# Patient Record
Sex: Male | Born: 1950
Health system: Southern US, Community
[De-identification: ages and names within clinical notes are randomized; demographics above are authoritative.]

## PROBLEM LIST (undated history)

## (undated) DIAGNOSIS — T7840XA Allergy, unspecified, initial encounter: Secondary | ICD-10-CM

## (undated) DIAGNOSIS — G4730 Sleep apnea, unspecified: Secondary | ICD-10-CM

## (undated) DIAGNOSIS — E785 Hyperlipidemia, unspecified: Secondary | ICD-10-CM

## (undated) DIAGNOSIS — M1990 Unspecified osteoarthritis, unspecified site: Secondary | ICD-10-CM

## (undated) DIAGNOSIS — I10 Essential (primary) hypertension: Secondary | ICD-10-CM

## (undated) DIAGNOSIS — T4145XA Adverse effect of unspecified anesthetic, initial encounter: Secondary | ICD-10-CM

## (undated) DIAGNOSIS — E119 Type 2 diabetes mellitus without complications: Secondary | ICD-10-CM

## (undated) DIAGNOSIS — F419 Anxiety disorder, unspecified: Secondary | ICD-10-CM

## (undated) DIAGNOSIS — E079 Disorder of thyroid, unspecified: Secondary | ICD-10-CM

## (undated) DIAGNOSIS — T8859XA Other complications of anesthesia, initial encounter: Secondary | ICD-10-CM

## (undated) DIAGNOSIS — C801 Malignant (primary) neoplasm, unspecified: Secondary | ICD-10-CM

## (undated) DIAGNOSIS — Z8719 Personal history of other diseases of the digestive system: Secondary | ICD-10-CM

## (undated) HISTORY — PX: JOINT REPLACEMENT: SHX530

## (undated) HISTORY — PX: TONSILLECTOMY: SUR1361

## (undated) HISTORY — DX: Anxiety disorder, unspecified: F41.9

## (undated) HISTORY — DX: Allergy, unspecified, initial encounter: T78.40XA

## (undated) HISTORY — DX: Essential (primary) hypertension: I10

## (undated) HISTORY — DX: Disorder of thyroid, unspecified: E07.9

## (undated) HISTORY — DX: Hyperlipidemia, unspecified: E78.5

## (undated) HISTORY — DX: Type 2 diabetes mellitus without complications: E11.9

## (undated) HISTORY — PX: BACK SURGERY: SHX140

## (undated) HISTORY — PX: SHOULDER SURGERY: SHX246

---

## 2007-12-10 ENCOUNTER — Encounter: Admission: RE | Admit: 2007-12-10 | Discharge: 2007-12-10 | Payer: Self-pay | Admitting: Family Medicine

## 2008-03-01 ENCOUNTER — Encounter (INDEPENDENT_AMBULATORY_CARE_PROVIDER_SITE_OTHER): Payer: Self-pay | Admitting: Interventional Radiology

## 2008-03-01 ENCOUNTER — Encounter: Admission: RE | Admit: 2008-03-01 | Discharge: 2008-03-01 | Payer: Self-pay | Admitting: Endocrinology

## 2008-03-01 ENCOUNTER — Other Ambulatory Visit: Admission: RE | Admit: 2008-03-01 | Discharge: 2008-03-01 | Payer: Self-pay | Admitting: Interventional Radiology

## 2008-03-02 ENCOUNTER — Encounter (INDEPENDENT_AMBULATORY_CARE_PROVIDER_SITE_OTHER): Payer: Self-pay | Admitting: Interventional Radiology

## 2008-11-22 ENCOUNTER — Encounter: Admission: RE | Admit: 2008-11-22 | Discharge: 2008-11-22 | Payer: Self-pay | Admitting: Endocrinology

## 2010-01-09 ENCOUNTER — Encounter: Admission: RE | Admit: 2010-01-09 | Discharge: 2010-01-09 | Payer: Self-pay | Admitting: Endocrinology

## 2010-08-24 ENCOUNTER — Other Ambulatory Visit: Payer: Self-pay | Admitting: Endocrinology

## 2010-08-24 DIAGNOSIS — E041 Nontoxic single thyroid nodule: Secondary | ICD-10-CM

## 2010-08-25 ENCOUNTER — Encounter: Payer: Self-pay | Admitting: Endocrinology

## 2011-01-08 ENCOUNTER — Other Ambulatory Visit: Payer: Self-pay

## 2011-01-10 ENCOUNTER — Ambulatory Visit
Admission: RE | Admit: 2011-01-10 | Discharge: 2011-01-10 | Disposition: A | Discharge status: Home / Self Care | Payer: BC Managed Care – PPO | Source: Ambulatory Visit | Attending: Endocrinology | Admitting: Endocrinology

## 2011-01-10 DIAGNOSIS — E041 Nontoxic single thyroid nodule: Secondary | ICD-10-CM

## 2011-01-14 ENCOUNTER — Other Ambulatory Visit: Payer: Self-pay

## 2011-10-02 ENCOUNTER — Other Ambulatory Visit: Payer: Self-pay | Admitting: Endocrinology

## 2011-10-02 DIAGNOSIS — E041 Nontoxic single thyroid nodule: Secondary | ICD-10-CM

## 2012-03-22 ENCOUNTER — Ambulatory Visit
Admission: RE | Admit: 2012-03-22 | Discharge: 2012-03-22 | Disposition: A | Discharge status: Home / Self Care | Payer: BC Managed Care – PPO | Source: Ambulatory Visit | Attending: Endocrinology | Admitting: Endocrinology

## 2012-03-22 DIAGNOSIS — E041 Nontoxic single thyroid nodule: Secondary | ICD-10-CM

## 2012-04-27 ENCOUNTER — Other Ambulatory Visit: Payer: Self-pay | Admitting: Gastroenterology

## 2012-12-06 ENCOUNTER — Other Ambulatory Visit: Payer: Self-pay | Admitting: Endocrinology

## 2012-12-06 DIAGNOSIS — E041 Nontoxic single thyroid nodule: Secondary | ICD-10-CM

## 2013-03-21 ENCOUNTER — Other Ambulatory Visit: Payer: BC Managed Care – PPO

## 2013-03-24 ENCOUNTER — Ambulatory Visit
Admission: RE | Admit: 2013-03-24 | Discharge: 2013-03-24 | Disposition: A | Discharge status: Home / Self Care | Payer: BC Managed Care – PPO | Source: Ambulatory Visit | Attending: Endocrinology | Admitting: Endocrinology

## 2013-03-24 DIAGNOSIS — E041 Nontoxic single thyroid nodule: Secondary | ICD-10-CM

## 2013-04-19 ENCOUNTER — Encounter (INDEPENDENT_AMBULATORY_CARE_PROVIDER_SITE_OTHER): Payer: Self-pay | Admitting: Surgery

## 2013-04-26 ENCOUNTER — Encounter (INDEPENDENT_AMBULATORY_CARE_PROVIDER_SITE_OTHER): Payer: Self-pay | Admitting: Surgery

## 2013-04-26 ENCOUNTER — Encounter (INDEPENDENT_AMBULATORY_CARE_PROVIDER_SITE_OTHER): Payer: Self-pay

## 2013-04-26 ENCOUNTER — Ambulatory Visit (INDEPENDENT_AMBULATORY_CARE_PROVIDER_SITE_OTHER): Payer: BC Managed Care – PPO | Admitting: Surgery

## 2013-04-26 VITALS — BP 134/76 | HR 68 | Resp 16 | Ht 68.0 in | Wt 238.4 lb

## 2013-04-26 DIAGNOSIS — D449 Neoplasm of uncertain behavior of unspecified endocrine gland: Secondary | ICD-10-CM

## 2013-04-26 DIAGNOSIS — D440 Neoplasm of uncertain behavior of thyroid gland: Secondary | ICD-10-CM

## 2013-04-26 NOTE — Patient Instructions (Signed)

## 2013-04-26 NOTE — Progress Notes (Signed)
General Surgery [facility]  Chief Complaint  Patient presents with  . New Evaluation    eval right thyroid nodule - referral from Dr. Adela Lank    HISTORY: Patient is a 62 year old male referred by his primary physician for evaluation of enlarging thyroid nodule. Patient has a history of thyroid nodules dating back over 10 years. He has been followed here as well as previously in [place]. 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 He has had sequential ultrasound examinations. His most recent study showed an interval increase in size of the dominant right sided nodule. The study was performed in August 2014 and shows a 3.9 x 2.2 x 2.6 cm nodule in the right lobe. There is also an 8 mm nodule in the thyroid isthmus. No nodules were identified in the left thyroid lobe.  Patient has had no prior history of head or neck surgery. He has been on thyroid medication for over 10 years and currently takes 250 mcg of levothyroxin daily. There is a family history of thyroid disease with his son having hyperthyroidism and his daughter having thyroid nodules. There is no family history of thyroid malignancy. There is no family history of other endocrine neoplasms.  Past Medical History  Diagnosis Date  . Hypertension   . Hyperlipidemia   . Thyroid disease   . Anxiety   . Diabetes mellitus without complication   . Allergy     Current Outpatient Prescriptions  Medication Sig Dispense Refill  . aspirin 81 MG tablet Take 81 mg by mouth daily.      . CELEBREX 200 MG capsule       . diazepam (VALIUM) 5 MG tablet Take 5 mg by mouth every 6 (six) hours as needed for anxiety.      . enalapril-hydrochlorothiazide (VASERETIC) 10-25 MG per tablet Take 1 tablet by mouth daily.      Marland Kitchen FLUoxetine (PROZAC) 40 MG capsule       . levothyroxine (SYNTHROID, LEVOTHROID) 25 MCG tablet Take 25 mcg by mouth daily before breakfast.       . loratadine (CLARITIN) 10 MG tablet Take 10 mg by mouth daily.      Marland Kitchen LYRICA 50 MG capsule       . metFORMIN (GLUCOPHAGE) 500 MG tablet Take 500 mg by mouth 2 (two) times daily with a meal.      . rosuvastatin (CRESTOR) 20 MG tablet Take 20 mg by mouth daily.       No current facility-administered medications for this visit.    No Known Allergies  History reviewed. No pertinent family history.  History   Social History  . Marital Status: Married    Spouse Name: N/A    Number of Children: N/A  . Years of Education: N/A   Social History Main Topics  . Smoking status: Never Smoker   . Smokeless tobacco: Never Used  . Alcohol Use: Yes     Comment: rare  . Drug Use: No  . Sexual Activity: None   Other Topics Concern  . None   Social History Narrative  . None    REVIEW OF SYSTEMS - PERTINENT POSITIVES ONLY: Denies tremor. Denies palpitation. Denies compressive symptoms.  EXAM: Filed Vitals:   04/26/13 1136  BP: 134/76  Pulse: 68  Resp: 16    HEENT: normocephalic; pupils equal and reactive; sclerae clear; dentition good; mucous membranes moist NECK:  Palpable nodule right thyroid  lobe, smooth, mobile with swallowing, nontender; nodule in the isthmus is not palpable; symmetric on extension; no palpable anterior or posterior cervical lymphadenopathy; no supraclavicular masses; no tenderness CHEST: clear to auscultation bilaterally without rales, rhonchi, or wheezes CARDIAC: regular rate and rhythm without significant murmur; peripheral pulses are full EXT:  non-tender without edema; no deformity NEURO: no gross focal deficits; no sign of tremor   LABORATORY RESULTS: See [facility] (CHL-Epic) for most recent results  RADIOLOGY RESULTS: See [facility] (CHL-Epic) for most recent results  IMPRESSION: #1 dominant right thyroid nodule, 3.9 cm, with interval enlargement and Hurthle cell cytopathology #2 hypothyroidism  PLAN: I had a lengthy discussion  with the patient and his wife regarding the above findings. We reviewed his test results. I have recommended a right thyroid lobectomy for definitive diagnosis. We discussed total thyroidectomy in the event of malignancy. We discussed the risk and benefits of the various procedures including the risk of injury to recurrent laryngeal nerves and the parathyroid glands. We discussed the hospital stay to be anticipated and the postoperative recovery. They understand and wish to proceed with surgery in the near future. We will make arrangements.  The risks and benefits of the procedure have been discussed at length with the patient.  The patient understands the proposed procedure, potential alternative treatments, and the course of recovery to be expected.  All of the patient's questions have been answered at this time.  The patient wishes to proceed with surgery.  Velora Heckler, MD, FACS General & Endocrine Surgery [facility]  Primary Care Physician: Michiel Sites, MD

## 2013-06-16 ENCOUNTER — Other Ambulatory Visit (HOSPITAL_COMMUNITY): Payer: Self-pay | Admitting: *Deleted

## 2013-06-17 ENCOUNTER — Encounter (INDEPENDENT_AMBULATORY_CARE_PROVIDER_SITE_OTHER): Payer: Self-pay

## 2013-06-17 ENCOUNTER — Ambulatory Visit (HOSPITAL_COMMUNITY)
Admission: RE | Admit: 2013-06-17 | Discharge: 2013-06-17 | Disposition: A | Discharge status: Home / Self Care | Payer: BC Managed Care – PPO | Source: Ambulatory Visit | Attending: Surgery | Admitting: Surgery

## 2013-06-17 ENCOUNTER — Encounter (HOSPITAL_COMMUNITY): Payer: Self-pay

## 2013-06-17 ENCOUNTER — Encounter (HOSPITAL_COMMUNITY): Payer: Self-pay | Admitting: Pharmacy Technician

## 2013-06-17 ENCOUNTER — Encounter (HOSPITAL_COMMUNITY)
Admission: RE | Admit: 2013-06-17 | Discharge: 2013-06-17 | Disposition: A | Discharge status: Home / Self Care | Payer: BC Managed Care – PPO | Source: Ambulatory Visit | Attending: Surgery | Admitting: Surgery

## 2013-06-17 DIAGNOSIS — I10 Essential (primary) hypertension: Secondary | ICD-10-CM | POA: Insufficient documentation

## 2013-06-17 DIAGNOSIS — Z01810 Encounter for preprocedural cardiovascular examination: Secondary | ICD-10-CM | POA: Insufficient documentation

## 2013-06-17 DIAGNOSIS — Z01812 Encounter for preprocedural laboratory examination: Secondary | ICD-10-CM | POA: Insufficient documentation

## 2013-06-17 DIAGNOSIS — Z01818 Encounter for other preprocedural examination: Secondary | ICD-10-CM | POA: Insufficient documentation

## 2013-06-17 HISTORY — DX: Unspecified osteoarthritis, unspecified site: M19.90

## 2013-06-17 HISTORY — DX: Personal history of other diseases of the digestive system: Z87.19

## 2013-06-17 HISTORY — DX: Adverse effect of unspecified anesthetic, initial encounter: T41.45XA

## 2013-06-17 HISTORY — DX: Sleep apnea, unspecified: G47.30

## 2013-06-17 HISTORY — DX: Other complications of anesthesia, initial encounter: T88.59XA

## 2013-06-17 LAB — CBC
Hemoglobin: 14.7 g/dL (ref 13.0–17.0)
MCH: 30.2 pg (ref 26.0–34.0)
MCHC: 34.2 g/dL (ref 30.0–36.0)
MCV: 88.5 fL (ref 78.0–100.0)
RBC: 4.86 MIL/uL (ref 4.22–5.81)

## 2013-06-17 LAB — BASIC METABOLIC PANEL
BUN: 18 mg/dL (ref 6–23)
CO2: 26 mEq/L (ref 19–32)
Glucose, Bld: 166 mg/dL — ABNORMAL HIGH (ref 70–99)
Potassium: 3.8 mEq/L (ref 3.5–5.1)
Sodium: 138 mEq/L (ref 135–145)

## 2013-06-17 NOTE — Patient Instructions (Signed)
20      Your procedure is scheduled on:  Thursday 06/23/2013  Report to [facility] at   0700 AM.  Call this number if you have problems the night before or morning of surgery:  [phone]   Remember:             IF YOU USE CPAP,BRING MASK AND TUBING AM OF SURGERY!   Do not eat food or drink liquids AFTER MIDNIGHT!  Take these medicines the morning of surgery with A SIP OF WATER: Levothyroxine, Claritin   Do not bring valuables to the hospital. [facility] IS NOT RESPONSIBLE  FOR ANY BELONGINGS OR VALUABLES BROUGHT TO HOSPITAL.  Marland Kitchen  Leave suitcase in the car. After surgery it may be brought to your room.  For patients admitted to the hospital, checkout time is 11:00 AM the day of              Discharge.    DO NOT WEAR JEWELRY , MAKE-UP, LOTIONS,POWDERS,PERFUMES!             WOMEN -DO NOT SHAVE LEGS OR UNDERARMS 12 HRS. BEFORE  SURGERY!               MEN MAY SHAVE AS USUAL!             CONTACTS,DENTURES OR BRIDGEWORK, FALSE EYELASHES MAY NOT BE WORN INTO SURGERY!                                           Patients discharged the day of surgery will not be allowed to drive home. If going home the same day of surgery, must have someone stay with you  first 24 hrs.at home and arrange for someone to drive you home from the Hospital.                         YOUR DRIVER IS:   Special Instructions:             Please read over the following fact sheets that you were given:             1. [place] PREPARING FOR SURGERY SHEET              2.INC[facility]IVE SPIROMETRY                                        [place].Deeann Servidio,RN,BSN     [phone]                FAILURE TO FOLLOW THESE INSTRUCTIONS MAY RESULT IN CANCELLATION OF YOUR SURGERY!               Patient Signature:___________________________

## 2013-06-18 NOTE — Progress Notes (Signed)
Quick Note:  These results are acceptable for scheduled surgery.  Nadege Carriger M. Severo Beber, MD, FACS [facility] Office: [phone]   ______ 

## 2013-06-23 ENCOUNTER — Ambulatory Visit (HOSPITAL_COMMUNITY): Payer: BC Managed Care – PPO | Admitting: Anesthesiology

## 2013-06-23 ENCOUNTER — Encounter (HOSPITAL_COMMUNITY): Payer: Self-pay | Admitting: *Deleted

## 2013-06-23 ENCOUNTER — Encounter (HOSPITAL_COMMUNITY): Payer: BC Managed Care – PPO | Admitting: Anesthesiology

## 2013-06-23 ENCOUNTER — Encounter (HOSPITAL_COMMUNITY)
Admission: RE | Disposition: A | Discharge status: Home / Self Care | Payer: Self-pay | Source: Ambulatory Visit | Attending: Surgery

## 2013-06-23 ENCOUNTER — Observation Stay (HOSPITAL_COMMUNITY)
Admission: RE | Admit: 2013-06-23 | Discharge: 2013-06-24 | Disposition: A | Discharge status: Home / Self Care | Payer: BC Managed Care – PPO | Source: Ambulatory Visit | Attending: Surgery | Admitting: Surgery

## 2013-06-23 DIAGNOSIS — E063 Autoimmune thyroiditis: Secondary | ICD-10-CM | POA: Insufficient documentation

## 2013-06-23 DIAGNOSIS — E119 Type 2 diabetes mellitus without complications: Secondary | ICD-10-CM | POA: Insufficient documentation

## 2013-06-23 DIAGNOSIS — I10 Essential (primary) hypertension: Secondary | ICD-10-CM | POA: Insufficient documentation

## 2013-06-23 DIAGNOSIS — D440 Neoplasm of uncertain behavior of thyroid gland: Secondary | ICD-10-CM

## 2013-06-23 DIAGNOSIS — Z7982 Long term (current) use of aspirin: Secondary | ICD-10-CM | POA: Insufficient documentation

## 2013-06-23 DIAGNOSIS — Z96659 Presence of unspecified artificial knee joint: Secondary | ICD-10-CM | POA: Insufficient documentation

## 2013-06-23 DIAGNOSIS — E785 Hyperlipidemia, unspecified: Secondary | ICD-10-CM | POA: Insufficient documentation

## 2013-06-23 DIAGNOSIS — C73 Malignant neoplasm of thyroid gland: Principal | ICD-10-CM | POA: Insufficient documentation

## 2013-06-23 DIAGNOSIS — G4730 Sleep apnea, unspecified: Secondary | ICD-10-CM | POA: Insufficient documentation

## 2013-06-23 DIAGNOSIS — Z79899 Other long term (current) drug therapy: Secondary | ICD-10-CM | POA: Insufficient documentation

## 2013-06-23 HISTORY — PX: THYROID LOBECTOMY: SHX420

## 2013-06-23 LAB — GLUCOSE, CAPILLARY
Glucose-Capillary: 120 mg/dL — ABNORMAL HIGH (ref 70–99)
Glucose-Capillary: 152 mg/dL — ABNORMAL HIGH (ref 70–99)

## 2013-06-23 SURGERY — LOBECTOMY, THYROID
Anesthesia: General | Site: Neck | Laterality: Right | Wound class: Clean

## 2013-06-23 MED ORDER — LIDOCAINE HCL (CARDIAC) 20 MG/ML IV SOLN
INTRAVENOUS | Status: AC
  Filled 2013-06-23: qty 5

## 2013-06-23 MED ORDER — ACETAMINOPHEN 325 MG PO TABS
650.0000 mg | ORAL_TABLET | ORAL | Status: DC | PRN
  Administered 2013-06-24: 650 mg via ORAL
  Filled 2013-06-23: qty 2

## 2013-06-23 MED ORDER — ONDANSETRON HCL 4 MG PO TABS: 4.0000 mg | ORAL_TABLET | Freq: Four times a day (QID) | ORAL | Status: DC | PRN

## 2013-06-23 MED ORDER — HYDROMORPHONE HCL PF 1 MG/ML IJ SOLN
INTRAMUSCULAR | Status: AC
  Filled 2013-06-23: qty 1

## 2013-06-23 MED ORDER — HYDROMORPHONE HCL PF 1 MG/ML IJ SOLN
1.0000 mg | INTRAMUSCULAR | Status: DC | PRN
  Administered 2013-06-23 (×2): 1 mg via INTRAVENOUS
  Filled 2013-06-23: qty 1

## 2013-06-23 MED ORDER — ONDANSETRON HCL 4 MG/2ML IJ SOLN
INTRAMUSCULAR | Status: DC | PRN
  Administered 2013-06-23: 4 mg via INTRAVENOUS

## 2013-06-23 MED ORDER — DIAZEPAM 5 MG PO TABS
5.0000 mg | ORAL_TABLET | Freq: Four times a day (QID) | ORAL | Status: DC | PRN
  Administered 2013-06-23: 5 mg via ORAL
  Filled 2013-06-23: qty 1

## 2013-06-23 MED ORDER — GLYCOPYRROLATE 0.2 MG/ML IJ SOLN
INTRAMUSCULAR | Status: DC | PRN
  Administered 2013-06-23: 0.6 mg via INTRAVENOUS

## 2013-06-23 MED ORDER — PROPOFOL 10 MG/ML IV BOLUS
INTRAVENOUS | Status: AC
  Filled 2013-06-23: qty 20

## 2013-06-23 MED ORDER — LACTATED RINGERS IV SOLN
INTRAVENOUS | Status: DC | PRN
  Administered 2013-06-23 (×2): via INTRAVENOUS

## 2013-06-23 MED ORDER — HYDROMORPHONE HCL PF 1 MG/ML IJ SOLN
0.2500 mg | INTRAMUSCULAR | Status: DC | PRN
  Administered 2013-06-23 (×4): 0.5 mg via INTRAVENOUS

## 2013-06-23 MED ORDER — ONDANSETRON HCL 4 MG/2ML IJ SOLN: 4.0000 mg | Freq: Four times a day (QID) | INTRAMUSCULAR | Status: DC | PRN

## 2013-06-23 MED ORDER — CEFAZOLIN SODIUM-DEXTROSE 2-3 GM-% IV SOLR
2.0000 g | INTRAVENOUS | Status: AC
  Administered 2013-06-23: 2 g via INTRAVENOUS

## 2013-06-23 MED ORDER — ENALAPRIL MALEATE 10 MG PO TABS
10.0000 mg | ORAL_TABLET | Freq: Every day | ORAL | Status: DC
  Administered 2013-06-23 – 2013-06-24 (×2): 10 mg via ORAL
  Filled 2013-06-23 (×2): qty 1

## 2013-06-23 MED ORDER — LIDOCAINE HCL (PF) 2 % IJ SOLN
INTRAMUSCULAR | Status: DC | PRN
  Administered 2013-06-23: 75 mg via INTRADERMAL

## 2013-06-23 MED ORDER — PREGABALIN 50 MG PO CAPS
50.0000 mg | ORAL_CAPSULE | ORAL | Status: DC
  Administered 2013-06-23: 50 mg via ORAL
  Filled 2013-06-23: qty 1

## 2013-06-23 MED ORDER — ZOLPIDEM TARTRATE 10 MG PO TABS
10.0000 mg | ORAL_TABLET | Freq: Every evening | ORAL | Status: DC | PRN
  Administered 2013-06-23: 10 mg via ORAL
  Filled 2013-06-23: qty 1

## 2013-06-23 MED ORDER — MIDAZOLAM HCL 5 MG/5ML IJ SOLN
INTRAMUSCULAR | Status: DC | PRN
  Administered 2013-06-23: 2 mg via INTRAVENOUS

## 2013-06-23 MED ORDER — CEFAZOLIN SODIUM-DEXTROSE 2-3 GM-% IV SOLR
INTRAVENOUS | Status: AC
  Filled 2013-06-23: qty 50

## 2013-06-23 MED ORDER — ENALAPRIL-HYDROCHLOROTHIAZIDE 10-25 MG PO TABS: 1.0000 | ORAL_TABLET | Freq: Every day | ORAL | Status: DC

## 2013-06-23 MED ORDER — MIDAZOLAM HCL 2 MG/2ML IJ SOLN
INTRAMUSCULAR | Status: AC
  Filled 2013-06-23: qty 2

## 2013-06-23 MED ORDER — FLUOXETINE HCL 20 MG PO CAPS
40.0000 mg | ORAL_CAPSULE | Freq: Every day | ORAL | Status: DC
  Administered 2013-06-23 – 2013-06-24 (×2): 40 mg via ORAL
  Filled 2013-06-23 (×2): qty 2

## 2013-06-23 MED ORDER — HYDROMORPHONE HCL PF 1 MG/ML IJ SOLN
INTRAMUSCULAR | Status: AC
  Administered 2013-06-23: 1 mg via INTRAVENOUS
  Filled 2013-06-23: qty 1

## 2013-06-23 MED ORDER — FENTANYL CITRATE 0.05 MG/ML IJ SOLN
INTRAMUSCULAR | Status: DC | PRN
  Administered 2013-06-23 (×3): 100 ug via INTRAVENOUS
  Administered 2013-06-23: 50 ug via INTRAVENOUS

## 2013-06-23 MED ORDER — FENTANYL CITRATE 0.05 MG/ML IJ SOLN
INTRAMUSCULAR | Status: AC
  Filled 2013-06-23: qty 2

## 2013-06-23 MED ORDER — NEOSTIGMINE METHYLSULFATE 1 MG/ML IJ SOLN
INTRAMUSCULAR | Status: AC
  Filled 2013-06-23: qty 10

## 2013-06-23 MED ORDER — PROPOFOL 10 MG/ML IV BOLUS
INTRAVENOUS | Status: DC | PRN
  Administered 2013-06-23: 200 mg via INTRAVENOUS

## 2013-06-23 MED ORDER — HYDROCHLOROTHIAZIDE 25 MG PO TABS
25.0000 mg | ORAL_TABLET | Freq: Every day | ORAL | Status: DC
  Administered 2013-06-23 – 2013-06-24 (×2): 25 mg via ORAL
  Filled 2013-06-23 (×2): qty 1

## 2013-06-23 MED ORDER — ONDANSETRON HCL 4 MG/2ML IJ SOLN
INTRAMUSCULAR | Status: AC
  Filled 2013-06-23: qty 2

## 2013-06-23 MED ORDER — GLYCOPYRROLATE 0.2 MG/ML IJ SOLN
INTRAMUSCULAR | Status: AC
  Filled 2013-06-23: qty 3

## 2013-06-23 MED ORDER — ROCURONIUM BROMIDE 100 MG/10ML IV SOLN
INTRAVENOUS | Status: AC
  Filled 2013-06-23: qty 1

## 2013-06-23 MED ORDER — KCL IN DEXTROSE-NACL 20-5-0.45 MEQ/L-%-% IV SOLN
INTRAVENOUS | Status: DC
  Administered 2013-06-23 – 2013-06-24 (×2): via INTRAVENOUS
  Filled 2013-06-23 (×2): qty 1000

## 2013-06-23 MED ORDER — METFORMIN HCL 500 MG PO TABS
500.0000 mg | ORAL_TABLET | Freq: Two times a day (BID) | ORAL | Status: DC
Start: 2013-06-23 — End: 2013-06-24
  Administered 2013-06-23 – 2013-06-24 (×2): 500 mg via ORAL
  Filled 2013-06-23 (×4): qty 1

## 2013-06-23 MED ORDER — LACTATED RINGERS IV SOLN: INTRAVENOUS | Status: DC

## 2013-06-23 MED ORDER — FENTANYL CITRATE 0.05 MG/ML IJ SOLN
INTRAMUSCULAR | Status: AC
  Filled 2013-06-23: qty 5

## 2013-06-23 MED ORDER — ROCURONIUM BROMIDE 100 MG/10ML IV SOLN
INTRAVENOUS | Status: DC | PRN
  Administered 2013-06-23: 60 mg via INTRAVENOUS
  Administered 2013-06-23: 20 mg via INTRAVENOUS

## 2013-06-23 MED ORDER — NEOSTIGMINE METHYLSULFATE 1 MG/ML IJ SOLN
INTRAMUSCULAR | Status: DC | PRN
  Administered 2013-06-23: 4 mg via INTRAVENOUS

## 2013-06-23 MED ORDER — LEVOTHYROXINE SODIUM 25 MCG PO TABS
25.0000 ug | ORAL_TABLET | Freq: Every day | ORAL | Status: DC
  Administered 2013-06-24: 25 ug via ORAL
  Filled 2013-06-23 (×2): qty 1

## 2013-06-23 MED ORDER — HYDROCODONE-ACETAMINOPHEN 5-325 MG PO TABS
1.0000 | ORAL_TABLET | ORAL | Status: DC | PRN
  Administered 2013-06-23 – 2013-06-24 (×2): 2 via ORAL
  Filled 2013-06-23 (×2): qty 2

## 2013-06-23 SURGICAL SUPPLY — 36 items
ATTRACTOMAT 16X20 MAGNETIC DRP (DRAPES) ×2 IMPLANT
BENZOIN TINCTURE PRP APPL 2/3 (GAUZE/BANDAGES/DRESSINGS) ×2 IMPLANT
BLADE HEX COATED 2.75 (ELECTRODE) ×2 IMPLANT
BLADE SURG 15 STRL LF DISP TIS (BLADE) ×1 IMPLANT
BLADE SURG 15 STRL SS (BLADE) ×1
CANISTER SUCTION 2500CC (MISCELLANEOUS) ×2 IMPLANT
CHLORAPREP W/TINT 10.5 ML (MISCELLANEOUS) ×2 IMPLANT
CLIP TI MEDIUM 6 (CLIP) ×4 IMPLANT
CLIP TI WIDE RED SMALL 6 (CLIP) ×6 IMPLANT
DISSECTOR ROUND CHERRY 3/8 STR (MISCELLANEOUS) IMPLANT
DRAPE PED LAPAROTOMY (DRAPES) ×2 IMPLANT
DRESSING SURGICEL FIBRLLR 1X2 (HEMOSTASIS) ×1 IMPLANT
DRSG SURGICEL FIBRILLAR 1X2 (HEMOSTASIS) ×2
ELECT REM PT RETURN 9FT ADLT (ELECTROSURGICAL) ×2
ELECTRODE REM PT RTRN 9FT ADLT (ELECTROSURGICAL) ×1 IMPLANT
GAUZE SPONGE 4X4 16PLY XRAY LF (GAUZE/BANDAGES/DRESSINGS) ×2 IMPLANT
GLOVE SURG ORTHO 8.0 STRL STRW (GLOVE) ×12 IMPLANT
GOWN PREVENTION PLUS LG XLONG (DISPOSABLE) ×2 IMPLANT
GOWN STRL REIN XL XLG (GOWN DISPOSABLE) ×4 IMPLANT
KIT BASIN OR (CUSTOM PROCEDURE TRAY) ×2 IMPLANT
NS IRRIG 1000ML POUR BTL (IV SOLUTION) ×2 IMPLANT
PACK BASIC VI WITH GOWN DISP (CUSTOM PROCEDURE TRAY) ×2 IMPLANT
PENCIL BUTTON HOLSTER BLD 10FT (ELECTRODE) ×2 IMPLANT
SHEARS HARMONIC 9CM CVD (BLADE) ×2 IMPLANT
SPONGE GAUZE 4X4 12PLY (GAUZE/BANDAGES/DRESSINGS) IMPLANT
STAPLER VISISTAT 35W (STAPLE) ×2 IMPLANT
STRIP CLOSURE SKIN 1/2X4 (GAUZE/BANDAGES/DRESSINGS) ×2 IMPLANT
SUT MNCRL AB 4-0 PS2 18 (SUTURE) ×2 IMPLANT
SUT SILK 2 0 (SUTURE) ×1
SUT SILK 2-0 18XBRD TIE 12 (SUTURE) ×1 IMPLANT
SUT SILK 3 0 (SUTURE)
SUT SILK 3-0 18XBRD TIE 12 (SUTURE) IMPLANT
SUT VIC AB 3-0 SH 18 (SUTURE) ×4 IMPLANT
SYR BULB IRRIGATION 50ML (SYRINGE) ×2 IMPLANT
TOWEL OR 17X26 10 PK STRL BLUE (TOWEL DISPOSABLE) ×2 IMPLANT
YANKAUER SUCT BULB TIP 10FT TU (MISCELLANEOUS) ×2 IMPLANT

## 2013-06-23 NOTE — Anesthesia Postprocedure Evaluation (Signed)
  Anesthesia Post-op Note  Patient: Allen Mcdaniel  Procedure(s) Performed: Procedure(s) (LRB): THYROID LOBECTOMY (Right)  Patient Location: PACU  Anesthesia Type: General  Level of Consciousness: awake and alert   Airway and Oxygen Therapy: Patient Spontanous Breathing  Post-op Pain: mild  Post-op Assessment: Post-op Vital signs reviewed, Patient's Cardiovascular Status Stable, Respiratory Function Stable, Patent Airway and No signs of Nausea or vomiting  Last Vitals:  Filed Vitals:   06/23/13 1145  BP:   Pulse:   Temp: 36.9 C  Resp:     Post-op Vital Signs: stable   Complications: No apparent anesthesia complications

## 2013-06-23 NOTE — Op Note (Signed)
NAMEISMAEL, KARGE NO.:  [id number]  MEDICAL RECORD NO.:  [id number]  LOCATION:  1511                         FACILITY:  [facility]  PHYSICIAN:  Velora Heckler, MD      DATE OF BIRTH:  May 30, 1951  DATE OF PROCEDURE:  06/23/2013                               OPERATIVE REPORT   PREOPERATIVE DIAGNOSIS:  Right thyroid nodule, Hurthle cell neoplasm.  POSTOPERATIVE DIAGNOSIS:  Right thyroid nodule, Hurthle cell neoplasm.  PROCEDURE:  Right thyroid lobectomy.  SURGEON:  Velora Heckler, MD, FACS  ANESTHESIA:  General per Dr. Ronelle Nigh.  ESTIMATED BLOOD LOSS:  Minimal.  PREPARATION:  ChloraPrep.  COMPLICATIONS:  None.  INDICATIONS:  The patient is a 62 year old male referred by his primary care physician for evaluation of enlarging thyroid nodule.  The patient has a history of thyroid nodules dating back over 10 years.  He has been followed in [place] and in [place].  Previous fine-needle aspiration of the dominant right thyroid nodule showed Hurthle cells. Sequential ultrasound examination have shown an interval increase in the size of the dominant right-sided thyroid nodule.  In August 2014, it measured 3.9 x 2.2 x 2.6 cm.  There was an 8-mm nodule in the thyroid isthmus.  No nodules were identified in the left thyroid lobe.  The patient now comes to Surgery for right thyroid lobectomy and isthmusectomy.  BODY OF REPORT:  Procedure was done in OR #1 at the [facility].  The patient was brought to the operating room, placed in a supine position on the operating room table.  Following administration of general anesthesia, the patient was positioned and then prepped and draped in the usual aseptic fashion.  After ascertaining that an adequate level of anesthesia had been achieved, a Kocher incision was made with a #15 blade.  Dissection was carried down through subcutaneous tissues and platysma.  Hemostasis was obtained  with electrocautery.  Skin flaps were elevated cephalad and caudad from the thyroid notch to the sternal notch.  A Mahorner self-retaining retractor was placed for exposure.  Strap muscles were incised in the midline, and the left thyroid lobe was exposed.  It is small.  There are no dominant masses or nodules present.  Next, we turned our attention to the right thyroid lobe.  Strap muscles were reflected laterally exposing a moderately enlarged right lobe. There was a dominant nodule in the mid and inferior portion of the right lobe.  There was a smaller nodule in the right side of the isthmus. Right lobe was gently dissected out with blunt dissection.  Superior pole vessels were dissected out and divided individually between small and medium Ligaclips with the Harmonic scalpel.  Gland was rolled anteriorly.  Inferior venous tributaries were divided between Ligaclips with the Harmonic scalpel.  Parathyroid tissue was identified and preserved.  Branches of the inferior thyroid artery were divided between small Ligaclips with the Harmonic scalpel.  Recurrent laryngeal nerve was identified and preserved.  Ligament of Allyson Sabal was released with the electrocautery and the gland was mobilized onto the anterior trachea. It was mobilized across the midline.  The isthmus was transected with the Harmonic scalpel at  its junction with the left thyroid lobe.  The entire right thyroid lobe and isthmus were submitted to Pathology for review.  Neck was irrigated with warm saline and good hemostasis was achieved with the electrocautery.  Fibrillar was placed throughout the operative field.  Palpation of the neck reveals no lymphadenopathy.  Strap muscles were reapproximated in the midline with interrupted 3-0 Vicryl sutures. Platysma was closed with interrupted 3-0 Vicryl sutures.  Skin was closed with a running 4-0 Monocryl subcuticular suture.  Wound was washed and dried and benzoin and Steri-Strips  were applied.  Sterile dressings were applied.  The patient was awakened from anesthesia and brought to the recovery room.  The patient tolerated the procedure well.   Velora Heckler, MD, FACS General & Endocrine Surgery [facility] Office: [phone]   TMG/MEDQ  D:  06/23/2013  T:  06/23/2013  Job:  [id number]  cc:   Brooke Bonito, M.D. Fax: [phone]

## 2013-06-23 NOTE — Anesthesia Preprocedure Evaluation (Addendum)
Anesthesia Evaluation  Patient identified by MRN, date of birth, ID band Patient awake    Reviewed: Allergy & Precautions, H&P , NPO status , Patient's Chart, lab work & pertinent test results, reviewed documented beta blocker date and time   Airway Mallampati: III TM Distance: >3 FB Neck ROM: full    Dental no notable dental hx. (+) Teeth Intact and Dental Advisory Given   Pulmonary sleep apnea and Continuous Positive Airway Pressure Ventilation ,  breath sounds clear to auscultation  Pulmonary exam normal       Cardiovascular Exercise Tolerance: Good hypertension, Pt. on medications Rhythm:regular Rate:Normal  RBBB   Neuro/Psych negative neurological ROS  negative psych ROS   GI/Hepatic negative GI ROS, Neg liver ROS, hiatal hernia,   Endo/Other  diabetes, Well Controlled, Type 2, Oral Hypoglycemic Agents  Renal/GU negative Renal ROS  negative genitourinary   Musculoskeletal   Abdominal   Peds  Hematology negative hematology ROS (+)   Anesthesia Other Findings   Reproductive/Obstetrics negative OB ROS                          Anesthesia Physical Anesthesia Plan  ASA: III  Anesthesia Plan: General   Post-op Pain Management:    Induction: Intravenous  Airway Management Planned: Oral ETT  Additional Equipment:   Intra-op Plan:   Post-operative Plan: Extubation in OR  Informed Consent: I have reviewed the patients History and Physical, chart, labs and discussed the procedure including the risks, benefits and alternatives for the proposed anesthesia with the patient or authorized representative who has indicated his/her understanding and acceptance.   Dental Advisory Given  Plan Discussed with: CRNA and Surgeon  Anesthesia Plan Comments:         Anesthesia Quick Evaluation

## 2013-06-23 NOTE — H&P (Signed)
Allen Mcdaniel is an 62 y.o. male.    General Surgery [facility]  Chief Complaint: right thyroid Hurthle cell neoplasm  HISTORY:  Patient is a 62 year old male referred by his primary physician for evaluation of enlarging thyroid nodule. Patient has a history of thyroid nodules dating back over 10 years. He has been followed here as well as previously in [place]. 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 He has had sequential ultrasound examinations. His most recent study showed an interval increase in size of the dominant right sided nodule. The study was performed in August 2014 and shows a 3.9 x 2.2 x 2.6 cm nodule in the right lobe. There is also an 8 mm nodule in the thyroid isthmus. No nodules were identified in the left thyroid lobe.  Past Medical History  Diagnosis Date  . Hypertension   . Hyperlipidemia   . Thyroid disease   . Anxiety   . Diabetes mellitus without complication   . Allergy   . Complication of anesthesia     with back surgery-too much Morphine,O2 too low  . Sleep apnea     setting -18  . H/O hiatal hernia   . Arthritis     Past Surgical History  Procedure Laterality Date  . Shoulder surgery    . Joint replacement      left and right knee  . Back surgery    . Tonsillectomy      as child    History reviewed. No pertinent family history. Social History:  reports that he has never smoked. He has never used smokeless tobacco. He reports that he drinks alcohol. He reports that he does not use illicit drugs.  Allergies: No Known Allergies  Medications Prior to Admission  Medication Sig Dispense Refill  . aspirin 81 MG tablet Take 81 mg by mouth daily.      . CELEBREX 200 MG capsule Take 200 mg by mouth once a week.       . diazepam (VALIUM) 5 MG tablet Take 5 mg by mouth every 6 (six) hours as needed for anxiety.      . enalapril-hydrochlorothiazide (VASERETIC)  10-25 MG per tablet Take 1 tablet by mouth daily.      Marland Kitchen FLUoxetine (PROZAC) 40 MG capsule Take 40 mg by mouth daily.       Marland Kitchen levothyroxine (SYNTHROID, LEVOTHROID) 25 MCG tablet Take 25 mcg by mouth daily before breakfast.      . loratadine (CLARITIN) 10 MG tablet Take 10 mg by mouth daily.      . metFORMIN (GLUCOPHAGE) 500 MG tablet Take 500 mg by mouth 2 (two) times daily with a meal.      . predniSONE (DELTASONE) 20 MG tablet Take 20 mg by mouth daily with breakfast. Take 3 tablets x 3 days, 2 tablets x 3 days, 1 tablet x 3 days once a day orally for 9 days      . pregabalin (LYRICA) 50 MG capsule Take 50 mg by mouth 2 (two) times a week.      . rosuvastatin (CRESTOR) 20 MG tablet Take 10 mg by mouth daily.         Results for orders placed during the hospital encounter of 06/23/13 (from the past 48 hour(s))  GLUCOSE, CAPILLARY     Status: Abnormal   Collection Time    06/23/13  8:15 AM      Result Value  Range   Glucose-Capillary 141 (*) 70 - 99 mg/dL   Comment 1 Documented in Chart     No results found.  Review of Systems  Constitutional: Negative.   HENT: Negative.   Eyes: Negative.   Respiratory: Negative.   Cardiovascular: Negative.   Gastrointestinal: Negative.   Genitourinary: Negative.   Musculoskeletal: Negative.   Skin: Negative.   Neurological: Negative.   Endo/Heme/Allergies: Negative.   Psychiatric/Behavioral: Negative.     Blood pressure 141/80, pulse 82, temperature 98.6 F (37 C), temperature source Oral, resp. rate 18, SpO2 98.00%. Physical Exam  Constitutional: He is oriented to person, place, and time. He appears well-developed and well-nourished. No distress.  HENT:  Head: Normocephalic and atraumatic.  Right Ear: External ear normal.  Left Ear: External ear normal.  Mouth/Throat: No oropharyngeal exudate.  Eyes: Conjunctivae are normal. Pupils are equal, round, and reactive to light. No scleral icterus.  Neck: Normal range of motion. Neck supple.  Tracheal deviation present. Thyromegaly present.  Dominant palpable nodule in right thyroid lobe, mobile, non-tender  Cardiovascular: Normal rate, regular rhythm and normal heart sounds.   No murmur heard. Respiratory: Effort normal and breath sounds normal. He has no wheezes.  GI: Soft. Bowel sounds are normal. He exhibits no distension.  Musculoskeletal: Normal range of motion. He exhibits no edema.  Lymphadenopathy:    He has no cervical adenopathy.  Neurological: He is alert and oriented to person, place, and time.  Skin: Skin is warm and dry.  Psychiatric: He has a normal mood and affect. His behavior is normal.     Assessment/Plan Right thyroid nodule, Hurthle cell neoplasm  Plan right thyroid lobectomy  The risks and benefits of the procedure have been discussed at length with the patient.  The patient understands the proposed procedure, potential alternative treatments, and the course of recovery to be expected.  All of the patient's questions have been answered at this time.  The patient wishes to proceed with surgery.  Velora Heckler, MD, FACS General & Endocrine Surgery [facility] Office: [phone]   Roux Brandy M 06/23/2013, 9:05 AM

## 2013-06-23 NOTE — Brief Op Note (Signed)
06/23/2013  10:38 AM  PATIENT:  Allen Mcdaniel  62 y.o. male  PRE-OPERATIVE DIAGNOSIS:  thyroid neoplasm of uncertain behavior   POST-OPERATIVE DIAGNOSIS:  thyroid neoplasm of uncertain behavior  PROCEDURE:  Procedure(s): THYROID LOBECTOMY (Right)  SURGEON:  Surgeon(s) and Role:    * Velora Heckler, MD - Primary  ANESTHESIA:   general  EBL:  Total I/O In: 1000 [I.V.:1000] Out: -   BLOOD ADMINISTERED:none  DRAINS: none   LOCAL MEDICATIONS USED:  NONE  SPECIMEN:  Excision  DISPOSITION OF SPECIMEN:  PATHOLOGY  COUNTS:  YES  TOURNIQUET:  * No tourniquets in log *  DICTATION: .Other Dictation: Dictation Number [id number]  PLAN OF CARE: Admit for overnight observation  PATIENT DISPOSITION:  PACU - hemodynamically stable.   Delay start of Pharmacological VTE agent (>24hrs) due to surgical blood loss or risk of bleeding: yes  Velora Heckler, MD, [facility] Office: [phone]

## 2013-06-23 NOTE — Transfer of Care (Signed)
Immediate Anesthesia Transfer of Care Note  Patient: Allen Mcdaniel  Procedure(s) Performed: Procedure(s): THYROID LOBECTOMY (Right)  Patient Location: PACU  Anesthesia Type:General  Level of Consciousness: awake, alert , oriented and patient cooperative  Airway & Oxygen Therapy: Patient Spontanous Breathing and Patient connected to face mask oxygen  Post-op Assessment: Report given to PACU RN, Post -op Vital signs reviewed and stable and Patient moving all extremities X 4  Post vital signs: Reviewed and stable  Complications: No apparent anesthesia complications

## 2013-06-24 ENCOUNTER — Encounter (HOSPITAL_COMMUNITY): Payer: Self-pay | Admitting: Surgery

## 2013-06-24 LAB — GLUCOSE, CAPILLARY
Glucose-Capillary: 182 mg/dL — ABNORMAL HIGH (ref 70–99)
Glucose-Capillary: 115 mg/dL — ABNORMAL HIGH (ref 70–99)

## 2013-06-24 MED ORDER — HYDROCODONE-ACETAMINOPHEN 5-325 MG PO TABS: 1.0000 | ORAL_TABLET | ORAL | Status: DC | PRN

## 2013-06-24 MED ORDER — SODIUM CHLORIDE 0.9 % IJ SOLN: 3.0000 mL | INTRAMUSCULAR | Status: DC | PRN

## 2013-06-24 MED ORDER — METOPROLOL TARTRATE 12.5 MG HALF TABLET
12.5000 mg | ORAL_TABLET | Freq: Two times a day (BID) | ORAL | Status: DC | PRN
  Filled 2013-06-24: qty 1

## 2013-06-24 MED ORDER — LORATADINE 10 MG PO TABS
10.0000 mg | ORAL_TABLET | Freq: Every day | ORAL | Status: DC
  Administered 2013-06-24: 10 mg via ORAL
  Filled 2013-06-24: qty 1

## 2013-06-24 MED ORDER — METOPROLOL TARTRATE 1 MG/ML IV SOLN
5.0000 mg | Freq: Four times a day (QID) | INTRAVENOUS | Status: DC | PRN
  Filled 2013-06-24: qty 5

## 2013-06-24 MED ORDER — SODIUM CHLORIDE 0.9 % IV SOLN: 250.0000 mL | INTRAVENOUS | Status: DC | PRN

## 2013-06-24 MED ORDER — SODIUM CHLORIDE 0.9 % IJ SOLN: 3.0000 mL | Freq: Two times a day (BID) | INTRAMUSCULAR | Status: DC

## 2013-06-24 NOTE — Discharge Summary (Signed)
Physician Discharge Summary [facility]  Patient ID: Allen Mcdaniel MRN: [id number] DOB/AGE: 63-19-1952 63 y.o.  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 Patient has a history of thyroid nodules dating back over 10 years. He has been followed here as well as previously in [place]. 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 He has had sequential ultrasound examinations. His most recent study showed an interval increase in size of the dominant right sided nodule. The study was performed in August 2014 and shows a 3.9 x 2.2 x 2.6 cm nodule in the right lobe. There is also an 8 mm nodule in the thyroid isthmus. No nodules were identified in the left thyroid lobe.  Patient was admitted for observation following thyroid surgery.  Post op course was uncomplicated.  Pain was well controlled.  Tolerated diet.  Patient was prepared for discharge home on POD#1.  Consults: None  Significant Diagnostic Studies: none  Treatments: surgery: right thyroid lobectomy and isthmusectomy  Discharge Exam: Blood pressure 138/81, pulse 90, temperature 98.2 F (36.8 C), temperature source Oral, resp. rate 20, height 5\' 8"  (1.727 m), weight 240 lb (108.863 kg), SpO2 95.00%. HEENT - clear Neck - wound clear and dry; minimal STS; voice normal Chest - clear bilaterally Cor - RRR  Disposition: Home with family  Discharge Orders   Future Orders Complete By Expires   Apply dressing  As directed    Scheduling Instructions:     Apply light gauze dressing to wound before discharge home today.   Diet - low sodium heart healthy  As directed    Discharge instructions  As directed    Comments:     THYROID & PARATHYROID SURGERY - POST OP INSTRUCTIONS  Always review your discharge instruction sheet from the facility where your surgery was performed.  A prescription for pain medication may be given to you upon discharge.  Take your pain medication as prescribed.  If narcotic pain medicine is not needed, then you may take acetaminophen (Tylenol) or ibuprofen (Advil) as needed.  Take your usually prescribed medications unless otherwise directed.  If you need a refill on your pain medication, please contact your pharmacy. They will contact our office to request authorization.  Prescriptions will not be processed after 5 pm or on weekends.  Start with a light diet upon arrival home, such as soup and crackers or toast.  Be sure to drink plenty of fluids daily.  Resume your normal diet the day after surgery.  Most patients will experience some swelling and bruising on the chest and neck area.  Ice packs will help.  Swelling and bruising can take several days to resolve.   It is common to experience some constipation if taking pain medication after surgery.  Increasing fluid intake and taking a stool softener will usually help or prevent this problem.  A mild laxative (Milk of Magnesia or Miralax) should be taken according to package directions if there are no bowel movements after 48 hours.  You may remove your bandages 24-48 hours after surgery, and you may shower at that time.  You have steri-strips (small skin tapes) in place  directly over the incision.  These strips should be left on the skin for 7-10 days and then removed.  You may resume regular (light) daily activities beginning the next day-such as daily self-care, walking, climbing stairs-gradually increasing activities as tolerated.  You may have sexual intercourse when it is comfortable.  Refrain from any heavy lifting or straining until  approved by your doctor.  You may drive when you no longer are taking prescription pain medication, you can comfortably wear a seatbelt, and you can safely maneuver your car and apply brakes.  You should see your doctor in the office for a follow-up appointment approximately two to three weeks after your surgery.  Make sure that you call for this appointment within a day or two after you arrive home to insure a convenient appointment time.  WHEN TO CALL YOUR DOCTOR: -- Fever greater than 101.5 -- Inability to urinate -- Nausea and/or vomiting - persistent -- Extreme swelling or bruising -- Continued bleeding from incision -- Increased pain, redness, or drainage from the incision -- Difficulty swallowing or breathing -- Muscle cramping or spasms -- Numbness or tingling in hands or around lips  The clinic staff is available to answer your questions during regular business hours.  Please don't hesitate to call and ask to speak to one of the nurses if you have concerns.  Velora Heckler, MD, FACS General & Endocrine Surgery [facility] Office: [phone]   Increase activity slowly  As directed    Remove dressing in 24 hours  As directed    Scheduling Instructions:     Apply light gauze dressing to wound before discharge home today.       Medication List         aspirin 81 MG tablet  Take 81 mg by mouth daily.     CELEBREX 200 MG capsule  Generic drug:  celecoxib  Take 200 mg by mouth once a week.     diazepam 5 MG tablet  Commonly known as:  VALIUM  Take 5 mg by mouth every 6 (six) hours as needed for anxiety.     enalapril-hydrochlorothiazide 10-25 MG per tablet  Commonly known as:  VASERETIC  Take 1 tablet by mouth daily.     FLUoxetine 40 MG capsule  Commonly known as:  PROZAC  Take 40 mg by mouth daily.     HYDROcodone-acetaminophen 5-325 MG per tablet  Commonly known as:  NORCO/VICODIN  Take 1-2 tablets by mouth every 4 (four) hours as needed  for moderate pain.     levothyroxine 25 MCG tablet  Commonly known as:  SYNTHROID, LEVOTHROID  Take 25 mcg by mouth daily before breakfast.     loratadine 10 MG tablet  Commonly known as:  CLARITIN  Take 10 mg by mouth daily.     metFORMIN 500 MG tablet  Commonly known as:  GLUCOPHAGE  Take 500 mg by mouth 2 (two) times daily with a meal.     predniSONE 20 MG tablet  Commonly known as:  DELTASONE  Take 20 mg by mouth daily with breakfast. Take 3 tablets x 3 days, 2 tablets x 3 days, 1 tablet x 3 days once a day orally for 9 days     pregabalin 50 MG capsule  Commonly known as:  LYRICA  Take 50 mg by mouth 2 (two) times a week.     rosuvastatin 20 MG tablet  Commonly known as:  CRESTOR  Take 10 mg by mouth daily.  zolpidem 10 MG tablet  Commonly known as:  AMBIEN  Take 10 mg by mouth at bedtime as needed for sleep.           Follow-up Information   Follow up with Velora Heckler, MD. Schedule an appointment as soon as possible for a visit in 3 weeks.   Specialty:  General Surgery   Contact information:   [address] [place] [address] [phone]       Velora Heckler, MD, [facility] Office: [phone]   Signed: Velora Heckler 06/24/2013, 12:30 PM

## 2013-06-24 NOTE — Progress Notes (Signed)
Patient to be discharged home in stable condition.  No change from morning assessment.  Discharge instructions and scripts provided to pt and spouse with verbal understanding.   No questions or concerns at this time.

## 2013-06-24 NOTE — Progress Notes (Signed)
UR completed 

## 2013-06-28 ENCOUNTER — Encounter (INDEPENDENT_AMBULATORY_CARE_PROVIDER_SITE_OTHER): Payer: Self-pay

## 2013-06-28 ENCOUNTER — Telehealth (INDEPENDENT_AMBULATORY_CARE_PROVIDER_SITE_OTHER): Payer: Self-pay

## 2013-06-28 NOTE — Telephone Encounter (Signed)
Per pt request letter retrieved and mailed to pt.

## 2013-06-28 NOTE — Telephone Encounter (Signed)
Letter ready in epic. Copy at front desk for pick up.

## 2013-06-28 NOTE — Telephone Encounter (Signed)
Message copied by Joanette Gula on Tue Jun 28, 2013  3:06 PM ------      Message from: Mervin Kung      Created: Tue Jun 28, 2013  2:45 PM      Contact: [phone]       Arline Asp,      Pt would like to go back to work on 07/11/2013. Please call him when note is ready.       Sonya.  ------

## 2013-06-28 NOTE — Telephone Encounter (Signed)
LMOM for pt to call to get appt date. Appt in system.

## 2013-07-05 ENCOUNTER — Telehealth (INDEPENDENT_AMBULATORY_CARE_PROVIDER_SITE_OTHER): Payer: Self-pay | Admitting: Surgery

## 2013-07-05 DIAGNOSIS — D440 Neoplasm of uncertain behavior of thyroid gland: Secondary | ICD-10-CM

## 2013-07-05 NOTE — Telephone Encounter (Signed)
Telephone call to patient with final pathology results from right thyroid lobectomy on 06/23/2013. The dominant feature is lymphocytic thyroiditis with hyperplastic nodules. Incidental finding of a 2 mm follicular variant of papillary thyroid carcinoma. Margins were negative.  I explained to the patient that this was an incidental finding and that no further surgical therapy would be necessary. I have recommended a ultrasound examination of the residual left thyroid lobe in one year.  Patient will be seen in the office for his postoperative visit on 07/21/2013.  Velora Heckler, MD, [facility] Office: [phone]

## 2013-07-21 ENCOUNTER — Ambulatory Visit (INDEPENDENT_AMBULATORY_CARE_PROVIDER_SITE_OTHER): Payer: BC Managed Care – PPO | Admitting: Surgery

## 2013-07-21 ENCOUNTER — Encounter (INDEPENDENT_AMBULATORY_CARE_PROVIDER_SITE_OTHER): Payer: Self-pay

## 2013-07-21 ENCOUNTER — Encounter (INDEPENDENT_AMBULATORY_CARE_PROVIDER_SITE_OTHER): Payer: Self-pay | Admitting: Surgery

## 2013-07-21 VITALS — BP 130/76 | HR 78 | Temp 97.3°F | Resp 18 | Wt 244.0 lb

## 2013-07-21 DIAGNOSIS — C73 Malignant neoplasm of thyroid gland: Secondary | ICD-10-CM

## 2013-07-21 DIAGNOSIS — E063 Autoimmune thyroiditis: Secondary | ICD-10-CM | POA: Insufficient documentation

## 2013-07-21 NOTE — Patient Instructions (Signed)
  COCOA BUTTER & VITAMIN E CREAM  ([employer] or other brand)  Apply cocoa butter/vitamin E cream to your incision 2 - 3 times daily.  Massage cream into incision for one minute with each application.  Use sunscreen (50 SPF or higher) for first 6 months after surgery if area is exposed to sun.  You may substitute Mederma or other scar reducing creams as desired.   

## 2013-07-21 NOTE — Progress Notes (Signed)
General Surgery [facility]  Chief Complaint  Patient presents with  . Routine Post Op    thyroidectomy 06/23/2013    HISTORY: Patient is a 62 year old male who underwent right thyroid lobectomy on 06/23/2013. Final pathology showed adenomatous nodules with extensive lymphocytic thyroiditis. An incidental finding was made of a 2 mm follicular variant of papillary thyroid carcinoma. Margins were negative. Postoperatively the patient has done well. He has had minimal pain. He is currently taking Synthroid 125 mcg daily.  EXAM: Surgical incision is healed nicely. Mild soft tissue swelling. No sign of seroma. No sign of infection. Voice quality is normal.  IMPRESSION: #1 status post right thyroid lobectomy #2 extensive lymphocytic thyroiditis with adenomatous nodules #3 incidental finding of follicular variant of papillary thyroid carcinoma, 2 mm, right thyroid lobe  PLAN: Patient plans to see Dr. Juleen China next month. He is scheduled for laboratory work on 08/08/2013. He may need to have his Synthroid dosage adjusted at that time.  I reviewed the pathology report with the patient at length. I provided him with a copy for his records. My recommendation for management of the 2 mm carcinoma is observation. We will ask Dr. Juleen China for his opinion on further management.  Patient will begin applying topical creams to his incision. He will return for wound check in 6 weeks.  Velora Heckler, MD, FACS General & Endocrine Surgery [facility]   Visit Diagnoses: 1. Thyroiditis, lymphocytic   2. Papillary thyroid carcinoma, follicular variant, 2mm, right lobe

## 2013-11-02 ENCOUNTER — Encounter (INDEPENDENT_AMBULATORY_CARE_PROVIDER_SITE_OTHER): Payer: BC Managed Care – PPO | Admitting: Ophthalmology

## 2013-11-02 DIAGNOSIS — H2510 Age-related nuclear cataract, unspecified eye: Secondary | ICD-10-CM

## 2013-11-02 DIAGNOSIS — E1139 Type 2 diabetes mellitus with other diabetic ophthalmic complication: Secondary | ICD-10-CM

## 2013-11-02 DIAGNOSIS — H35349 Macular cyst, hole, or pseudohole, unspecified eye: Secondary | ICD-10-CM

## 2013-11-02 DIAGNOSIS — E1165 Type 2 diabetes mellitus with hyperglycemia: Secondary | ICD-10-CM

## 2013-11-02 DIAGNOSIS — I10 Essential (primary) hypertension: Secondary | ICD-10-CM

## 2013-11-02 DIAGNOSIS — H3530 Unspecified macular degeneration: Secondary | ICD-10-CM

## 2013-11-02 DIAGNOSIS — H35379 Puckering of macula, unspecified eye: Secondary | ICD-10-CM

## 2013-11-02 DIAGNOSIS — E11319 Type 2 diabetes mellitus with unspecified diabetic retinopathy without macular edema: Secondary | ICD-10-CM

## 2013-11-02 DIAGNOSIS — H35039 Hypertensive retinopathy, unspecified eye: Secondary | ICD-10-CM

## 2013-11-02 DIAGNOSIS — H43819 Vitreous degeneration, unspecified eye: Secondary | ICD-10-CM

## 2014-02-21 ENCOUNTER — Other Ambulatory Visit: Payer: Self-pay | Admitting: Endocrinology

## 2014-02-21 DIAGNOSIS — E041 Nontoxic single thyroid nodule: Secondary | ICD-10-CM

## 2014-02-27 ENCOUNTER — Ambulatory Visit
Admission: RE | Admit: 2014-02-27 | Discharge: 2014-02-27 | Disposition: A | Discharge status: Home / Self Care | Payer: BC Managed Care – PPO | Source: Ambulatory Visit | Attending: Endocrinology | Admitting: Endocrinology

## 2014-02-27 DIAGNOSIS — E041 Nontoxic single thyroid nodule: Secondary | ICD-10-CM

## 2015-02-16 DIAGNOSIS — E789 Disorder of lipoprotein metabolism, unspecified: Secondary | ICD-10-CM | POA: Diagnosis not present

## 2015-02-16 DIAGNOSIS — E041 Nontoxic single thyroid nodule: Secondary | ICD-10-CM | POA: Diagnosis not present

## 2015-02-21 DIAGNOSIS — E785 Hyperlipidemia, unspecified: Secondary | ICD-10-CM | POA: Diagnosis not present

## 2015-02-21 DIAGNOSIS — E669 Obesity, unspecified: Secondary | ICD-10-CM | POA: Diagnosis not present

## 2015-02-21 DIAGNOSIS — N529 Male erectile dysfunction, unspecified: Secondary | ICD-10-CM | POA: Diagnosis not present

## 2015-02-21 DIAGNOSIS — F419 Anxiety disorder, unspecified: Secondary | ICD-10-CM | POA: Diagnosis not present

## 2015-02-21 DIAGNOSIS — G4700 Insomnia, unspecified: Secondary | ICD-10-CM | POA: Diagnosis not present

## 2015-02-21 DIAGNOSIS — M109 Gout, unspecified: Secondary | ICD-10-CM | POA: Diagnosis not present

## 2015-02-21 DIAGNOSIS — Z23 Encounter for immunization: Secondary | ICD-10-CM | POA: Diagnosis not present

## 2015-02-21 DIAGNOSIS — I10 Essential (primary) hypertension: Secondary | ICD-10-CM | POA: Diagnosis not present

## 2015-02-21 DIAGNOSIS — E119 Type 2 diabetes mellitus without complications: Secondary | ICD-10-CM | POA: Diagnosis not present

## 2015-02-22 DIAGNOSIS — E041 Nontoxic single thyroid nodule: Secondary | ICD-10-CM | POA: Diagnosis not present

## 2015-03-28 DIAGNOSIS — G4733 Obstructive sleep apnea (adult) (pediatric): Secondary | ICD-10-CM | POA: Diagnosis not present

## 2015-04-12 DIAGNOSIS — G4733 Obstructive sleep apnea (adult) (pediatric): Secondary | ICD-10-CM | POA: Diagnosis not present

## 2015-05-09 DIAGNOSIS — M25552 Pain in left hip: Secondary | ICD-10-CM | POA: Diagnosis not present

## 2015-05-09 DIAGNOSIS — M4722 Other spondylosis with radiculopathy, cervical region: Secondary | ICD-10-CM | POA: Diagnosis not present

## 2015-05-09 DIAGNOSIS — M791 Myalgia: Secondary | ICD-10-CM | POA: Diagnosis not present

## 2015-05-09 DIAGNOSIS — M545 Low back pain: Secondary | ICD-10-CM | POA: Diagnosis not present

## 2015-05-09 DIAGNOSIS — M47896 Other spondylosis, lumbar region: Secondary | ICD-10-CM | POA: Diagnosis not present

## 2015-05-09 DIAGNOSIS — M461 Sacroiliitis, not elsewhere classified: Secondary | ICD-10-CM | POA: Diagnosis not present

## 2015-05-09 DIAGNOSIS — M542 Cervicalgia: Secondary | ICD-10-CM | POA: Diagnosis not present

## 2015-05-09 DIAGNOSIS — M4716 Other spondylosis with myelopathy, lumbar region: Secondary | ICD-10-CM | POA: Diagnosis not present

## 2015-05-11 ENCOUNTER — Other Ambulatory Visit: Payer: Self-pay | Admitting: Orthopaedic Surgery

## 2015-05-11 DIAGNOSIS — M4722 Other spondylosis with radiculopathy, cervical region: Secondary | ICD-10-CM

## 2015-05-11 DIAGNOSIS — M4716 Other spondylosis with myelopathy, lumbar region: Secondary | ICD-10-CM

## 2015-05-22 ENCOUNTER — Ambulatory Visit
Admission: RE | Admit: 2015-05-22 | Discharge: 2015-05-22 | Disposition: A | Discharge status: Home / Self Care | Payer: Medicare Other | Source: Ambulatory Visit | Attending: Orthopaedic Surgery | Admitting: Orthopaedic Surgery

## 2015-05-22 DIAGNOSIS — M4716 Other spondylosis with myelopathy, lumbar region: Secondary | ICD-10-CM

## 2015-05-22 DIAGNOSIS — M4806 Spinal stenosis, lumbar region: Secondary | ICD-10-CM | POA: Diagnosis not present

## 2015-05-22 DIAGNOSIS — M4722 Other spondylosis with radiculopathy, cervical region: Secondary | ICD-10-CM

## 2015-05-22 DIAGNOSIS — M47812 Spondylosis without myelopathy or radiculopathy, cervical region: Secondary | ICD-10-CM | POA: Diagnosis not present

## 2015-06-13 DIAGNOSIS — M47896 Other spondylosis, lumbar region: Secondary | ICD-10-CM | POA: Diagnosis not present

## 2015-06-13 DIAGNOSIS — M542 Cervicalgia: Secondary | ICD-10-CM | POA: Diagnosis not present

## 2015-06-13 DIAGNOSIS — M4716 Other spondylosis with myelopathy, lumbar region: Secondary | ICD-10-CM | POA: Diagnosis not present

## 2015-06-13 DIAGNOSIS — M545 Low back pain: Secondary | ICD-10-CM | POA: Diagnosis not present

## 2015-06-14 DIAGNOSIS — I10 Essential (primary) hypertension: Secondary | ICD-10-CM | POA: Diagnosis not present

## 2015-06-14 DIAGNOSIS — E669 Obesity, unspecified: Secondary | ICD-10-CM | POA: Diagnosis not present

## 2015-06-14 DIAGNOSIS — E039 Hypothyroidism, unspecified: Secondary | ICD-10-CM | POA: Diagnosis not present

## 2015-06-14 DIAGNOSIS — R945 Abnormal results of liver function studies: Secondary | ICD-10-CM | POA: Diagnosis not present

## 2015-06-14 DIAGNOSIS — E119 Type 2 diabetes mellitus without complications: Secondary | ICD-10-CM | POA: Diagnosis not present

## 2015-06-14 DIAGNOSIS — F419 Anxiety disorder, unspecified: Secondary | ICD-10-CM | POA: Diagnosis not present

## 2015-06-14 DIAGNOSIS — N529 Male erectile dysfunction, unspecified: Secondary | ICD-10-CM | POA: Diagnosis not present

## 2015-06-14 DIAGNOSIS — E785 Hyperlipidemia, unspecified: Secondary | ICD-10-CM | POA: Diagnosis not present

## 2015-06-14 DIAGNOSIS — R42 Dizziness and giddiness: Secondary | ICD-10-CM | POA: Diagnosis not present

## 2015-06-14 DIAGNOSIS — G4700 Insomnia, unspecified: Secondary | ICD-10-CM | POA: Diagnosis not present

## 2015-06-14 DIAGNOSIS — M109 Gout, unspecified: Secondary | ICD-10-CM | POA: Diagnosis not present

## 2015-06-19 DIAGNOSIS — M4806 Spinal stenosis, lumbar region: Secondary | ICD-10-CM | POA: Diagnosis not present

## 2015-06-19 DIAGNOSIS — R945 Abnormal results of liver function studies: Secondary | ICD-10-CM | POA: Diagnosis not present

## 2015-06-19 DIAGNOSIS — R7989 Other specified abnormal findings of blood chemistry: Secondary | ICD-10-CM | POA: Diagnosis not present

## 2015-06-21 ENCOUNTER — Other Ambulatory Visit (HOSPITAL_BASED_OUTPATIENT_CLINIC_OR_DEPARTMENT_OTHER): Payer: Self-pay | Admitting: Family Medicine

## 2015-06-21 DIAGNOSIS — R945 Abnormal results of liver function studies: Principal | ICD-10-CM

## 2015-06-21 DIAGNOSIS — R7989 Other specified abnormal findings of blood chemistry: Secondary | ICD-10-CM

## 2015-06-25 ENCOUNTER — Ambulatory Visit (HOSPITAL_BASED_OUTPATIENT_CLINIC_OR_DEPARTMENT_OTHER): Payer: Medicare Other

## 2015-07-03 DIAGNOSIS — M4806 Spinal stenosis, lumbar region: Secondary | ICD-10-CM | POA: Diagnosis not present

## 2015-07-04 ENCOUNTER — Ambulatory Visit (HOSPITAL_BASED_OUTPATIENT_CLINIC_OR_DEPARTMENT_OTHER)
Admission: RE | Admit: 2015-07-04 | Discharge: 2015-07-04 | Disposition: A | Discharge status: Home / Self Care | Payer: Medicare Other | Source: Ambulatory Visit | Attending: Family Medicine | Admitting: Family Medicine

## 2015-07-04 DIAGNOSIS — R932 Abnormal findings on diagnostic imaging of liver and biliary tract: Secondary | ICD-10-CM | POA: Diagnosis not present

## 2015-07-04 DIAGNOSIS — R945 Abnormal results of liver function studies: Secondary | ICD-10-CM

## 2015-07-04 DIAGNOSIS — I77811 Abdominal aortic ectasia: Secondary | ICD-10-CM | POA: Diagnosis not present

## 2015-07-04 DIAGNOSIS — R938 Abnormal findings on diagnostic imaging of other specified body structures: Secondary | ICD-10-CM | POA: Insufficient documentation

## 2015-07-04 DIAGNOSIS — Q631 Lobulated, fused and horseshoe kidney: Secondary | ICD-10-CM | POA: Diagnosis not present

## 2015-07-04 DIAGNOSIS — R7989 Other specified abnormal findings of blood chemistry: Secondary | ICD-10-CM | POA: Insufficient documentation

## 2015-07-06 DIAGNOSIS — R269 Unspecified abnormalities of gait and mobility: Secondary | ICD-10-CM | POA: Diagnosis not present

## 2015-07-06 DIAGNOSIS — R42 Dizziness and giddiness: Secondary | ICD-10-CM | POA: Diagnosis not present

## 2015-07-09 DIAGNOSIS — G4733 Obstructive sleep apnea (adult) (pediatric): Secondary | ICD-10-CM | POA: Diagnosis not present

## 2015-07-11 DIAGNOSIS — M4716 Other spondylosis with myelopathy, lumbar region: Secondary | ICD-10-CM | POA: Diagnosis not present

## 2015-07-11 DIAGNOSIS — M4806 Spinal stenosis, lumbar region: Secondary | ICD-10-CM | POA: Diagnosis not present

## 2015-07-17 DIAGNOSIS — H40013 Open angle with borderline findings, low risk, bilateral: Secondary | ICD-10-CM | POA: Diagnosis not present

## 2015-07-20 DIAGNOSIS — M4716 Other spondylosis with myelopathy, lumbar region: Secondary | ICD-10-CM | POA: Diagnosis not present

## 2015-07-20 DIAGNOSIS — M4806 Spinal stenosis, lumbar region: Secondary | ICD-10-CM | POA: Diagnosis not present

## 2015-07-26 DIAGNOSIS — M4716 Other spondylosis with myelopathy, lumbar region: Secondary | ICD-10-CM | POA: Diagnosis not present

## 2015-07-26 DIAGNOSIS — M961 Postlaminectomy syndrome, not elsewhere classified: Secondary | ICD-10-CM | POA: Diagnosis not present

## 2015-08-07 DIAGNOSIS — M961 Postlaminectomy syndrome, not elsewhere classified: Secondary | ICD-10-CM | POA: Diagnosis not present

## 2015-08-07 DIAGNOSIS — M545 Low back pain: Secondary | ICD-10-CM | POA: Diagnosis not present

## 2015-08-07 DIAGNOSIS — M4806 Spinal stenosis, lumbar region: Secondary | ICD-10-CM | POA: Diagnosis not present

## 2015-08-07 DIAGNOSIS — Z7984 Long term (current) use of oral hypoglycemic drugs: Secondary | ICD-10-CM | POA: Diagnosis not present

## 2015-08-07 DIAGNOSIS — M47816 Spondylosis without myelopathy or radiculopathy, lumbar region: Secondary | ICD-10-CM | POA: Diagnosis not present

## 2015-08-07 DIAGNOSIS — Z23 Encounter for immunization: Secondary | ICD-10-CM | POA: Diagnosis not present

## 2015-08-07 DIAGNOSIS — E1121 Type 2 diabetes mellitus with diabetic nephropathy: Secondary | ICD-10-CM | POA: Diagnosis present

## 2015-08-07 DIAGNOSIS — T84296A Other mechanical complication of internal fixation device of vertebrae, initial encounter: Secondary | ICD-10-CM | POA: Diagnosis not present

## 2015-08-07 DIAGNOSIS — M4816 Ankylosing hyperostosis [Forestier], lumbar region: Secondary | ICD-10-CM | POA: Diagnosis not present

## 2015-08-07 DIAGNOSIS — I10 Essential (primary) hypertension: Secondary | ICD-10-CM | POA: Diagnosis present

## 2015-08-07 DIAGNOSIS — M4327 Fusion of spine, lumbosacral region: Secondary | ICD-10-CM | POA: Diagnosis not present

## 2015-08-07 DIAGNOSIS — M4716 Other spondylosis with myelopathy, lumbar region: Secondary | ICD-10-CM | POA: Diagnosis not present

## 2015-09-04 DIAGNOSIS — M4326 Fusion of spine, lumbar region: Secondary | ICD-10-CM | POA: Diagnosis not present

## 2015-09-04 DIAGNOSIS — M791 Myalgia: Secondary | ICD-10-CM | POA: Diagnosis not present

## 2015-09-13 DIAGNOSIS — E0789 Other specified disorders of thyroid: Secondary | ICD-10-CM | POA: Diagnosis not present

## 2015-09-13 DIAGNOSIS — E041 Nontoxic single thyroid nodule: Secondary | ICD-10-CM | POA: Diagnosis not present

## 2015-09-18 DIAGNOSIS — E109 Type 1 diabetes mellitus without complications: Secondary | ICD-10-CM | POA: Diagnosis not present

## 2015-09-18 DIAGNOSIS — M109 Gout, unspecified: Secondary | ICD-10-CM | POA: Diagnosis not present

## 2015-09-18 DIAGNOSIS — E032 Hypothyroidism due to medicaments and other exogenous substances: Secondary | ICD-10-CM | POA: Diagnosis not present

## 2015-09-21 DIAGNOSIS — M109 Gout, unspecified: Secondary | ICD-10-CM | POA: Diagnosis not present

## 2015-09-21 DIAGNOSIS — Z7984 Long term (current) use of oral hypoglycemic drugs: Secondary | ICD-10-CM | POA: Diagnosis not present

## 2015-09-21 DIAGNOSIS — E669 Obesity, unspecified: Secondary | ICD-10-CM | POA: Diagnosis not present

## 2015-09-21 DIAGNOSIS — E785 Hyperlipidemia, unspecified: Secondary | ICD-10-CM | POA: Diagnosis not present

## 2015-09-21 DIAGNOSIS — E1165 Type 2 diabetes mellitus with hyperglycemia: Secondary | ICD-10-CM | POA: Diagnosis not present

## 2015-09-21 DIAGNOSIS — Z6837 Body mass index (BMI) 37.0-37.9, adult: Secondary | ICD-10-CM | POA: Diagnosis not present

## 2015-09-21 DIAGNOSIS — I10 Essential (primary) hypertension: Secondary | ICD-10-CM | POA: Diagnosis not present

## 2015-09-21 DIAGNOSIS — R945 Abnormal results of liver function studies: Secondary | ICD-10-CM | POA: Diagnosis not present

## 2015-09-21 DIAGNOSIS — N529 Male erectile dysfunction, unspecified: Secondary | ICD-10-CM | POA: Diagnosis not present

## 2015-09-21 DIAGNOSIS — G4700 Insomnia, unspecified: Secondary | ICD-10-CM | POA: Diagnosis not present

## 2015-09-21 DIAGNOSIS — E1140 Type 2 diabetes mellitus with diabetic neuropathy, unspecified: Secondary | ICD-10-CM | POA: Diagnosis not present

## 2015-10-16 DIAGNOSIS — H40012 Open angle with borderline findings, low risk, left eye: Secondary | ICD-10-CM | POA: Diagnosis not present

## 2015-10-30 DIAGNOSIS — E041 Nontoxic single thyroid nodule: Secondary | ICD-10-CM | POA: Diagnosis not present

## 2015-10-30 DIAGNOSIS — E789 Disorder of lipoprotein metabolism, unspecified: Secondary | ICD-10-CM | POA: Diagnosis not present

## 2015-10-31 DIAGNOSIS — M791 Myalgia: Secondary | ICD-10-CM | POA: Diagnosis not present

## 2015-10-31 DIAGNOSIS — M4326 Fusion of spine, lumbar region: Secondary | ICD-10-CM | POA: Diagnosis not present

## 2015-11-05 DIAGNOSIS — E032 Hypothyroidism due to medicaments and other exogenous substances: Secondary | ICD-10-CM | POA: Diagnosis not present

## 2015-11-05 DIAGNOSIS — E118 Type 2 diabetes mellitus with unspecified complications: Secondary | ICD-10-CM | POA: Diagnosis not present

## 2015-11-05 DIAGNOSIS — M109 Gout, unspecified: Secondary | ICD-10-CM | POA: Diagnosis not present

## 2015-11-05 DIAGNOSIS — E7800 Pure hypercholesterolemia, unspecified: Secondary | ICD-10-CM | POA: Diagnosis not present

## 2015-11-07 DIAGNOSIS — M545 Low back pain: Secondary | ICD-10-CM | POA: Diagnosis not present

## 2015-11-13 DIAGNOSIS — M545 Low back pain: Secondary | ICD-10-CM | POA: Diagnosis not present

## 2015-11-15 DIAGNOSIS — M545 Low back pain: Secondary | ICD-10-CM | POA: Diagnosis not present

## 2015-11-21 DIAGNOSIS — M545 Low back pain: Secondary | ICD-10-CM | POA: Diagnosis not present

## 2015-11-23 DIAGNOSIS — M545 Low back pain: Secondary | ICD-10-CM | POA: Diagnosis not present

## 2015-11-27 DIAGNOSIS — H40013 Open angle with borderline findings, low risk, bilateral: Secondary | ICD-10-CM | POA: Diagnosis not present

## 2015-11-29 DIAGNOSIS — M545 Low back pain: Secondary | ICD-10-CM | POA: Diagnosis not present

## 2015-12-04 DIAGNOSIS — M545 Low back pain: Secondary | ICD-10-CM | POA: Diagnosis not present

## 2015-12-06 DIAGNOSIS — M545 Low back pain: Secondary | ICD-10-CM | POA: Diagnosis not present

## 2015-12-11 DIAGNOSIS — M545 Low back pain: Secondary | ICD-10-CM | POA: Diagnosis not present

## 2015-12-13 DIAGNOSIS — M545 Low back pain: Secondary | ICD-10-CM | POA: Diagnosis not present

## 2016-01-24 DIAGNOSIS — Z961 Presence of intraocular lens: Secondary | ICD-10-CM | POA: Diagnosis not present

## 2016-01-24 DIAGNOSIS — E119 Type 2 diabetes mellitus without complications: Secondary | ICD-10-CM | POA: Diagnosis not present

## 2016-01-24 DIAGNOSIS — H401121 Primary open-angle glaucoma, left eye, mild stage: Secondary | ICD-10-CM | POA: Diagnosis not present

## 2016-01-24 DIAGNOSIS — H40011 Open angle with borderline findings, low risk, right eye: Secondary | ICD-10-CM | POA: Diagnosis not present

## 2016-02-06 IMAGING — US US ABDOMEN COMPLETE
1 series · 13 of 25 positions shown · non-contrast
Comparison: None.

CLINICAL DATA: Elevated LFTs.

EXAM:
ULTRASOUND ABDOMEN COMPLETE

[Series 1: us abdomen complete · 0.22mm/px · 13 of 74 slices shown]
[im 1/74]
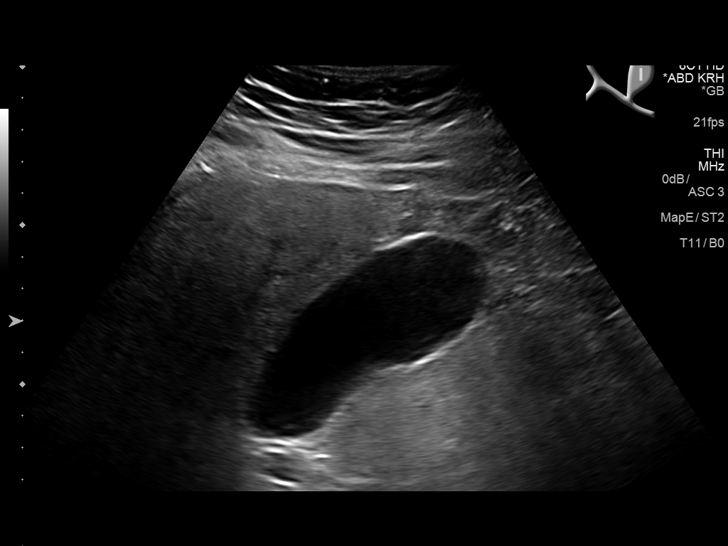
[im 7/74]
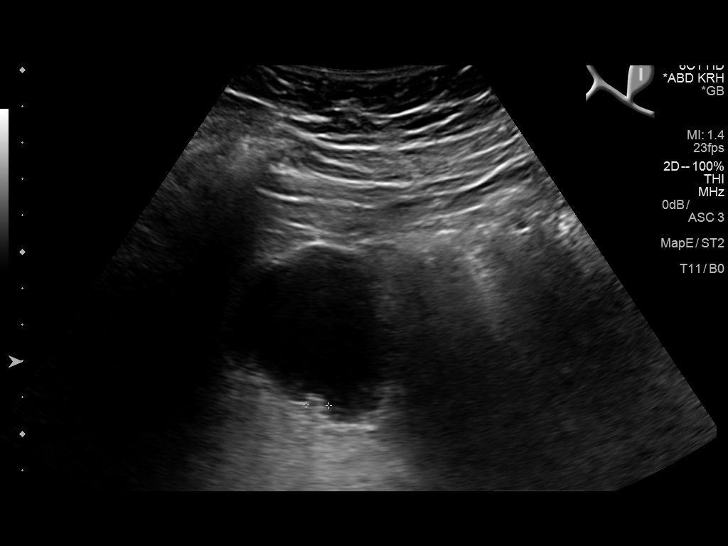
[im 13/74]
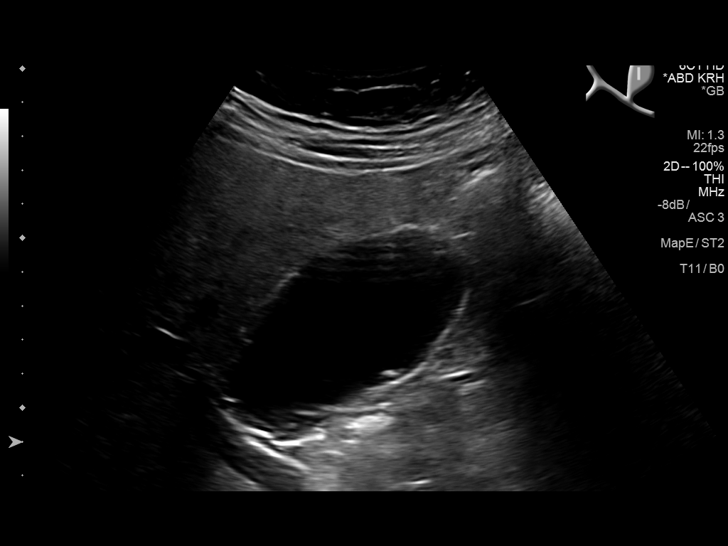
[im 19/74]
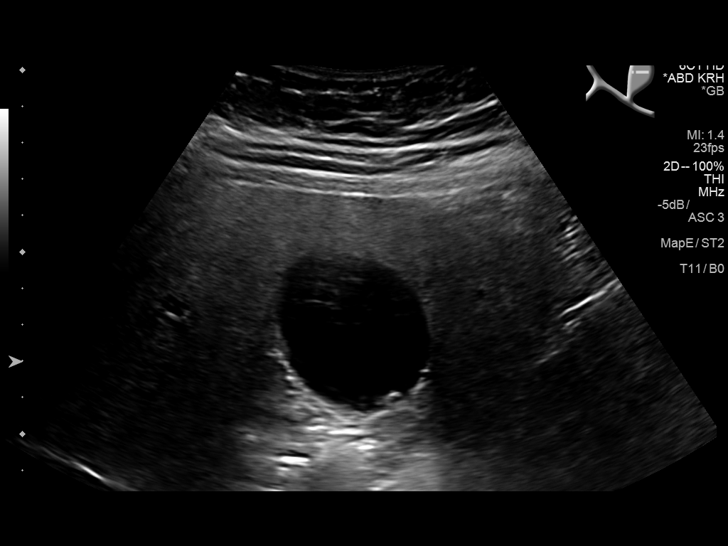
[im 25/74]
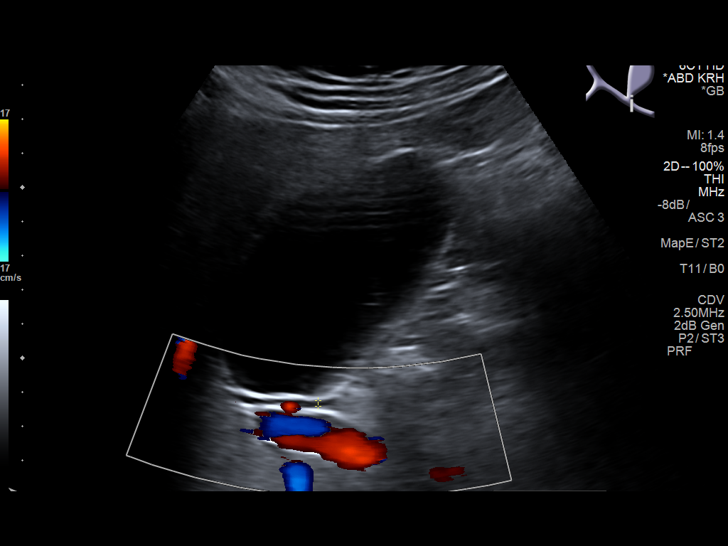
[im 31/74]
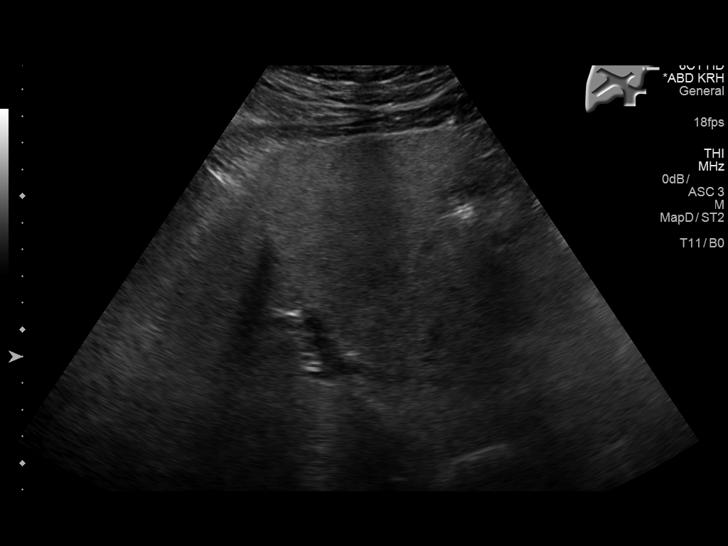
[im 37/74]
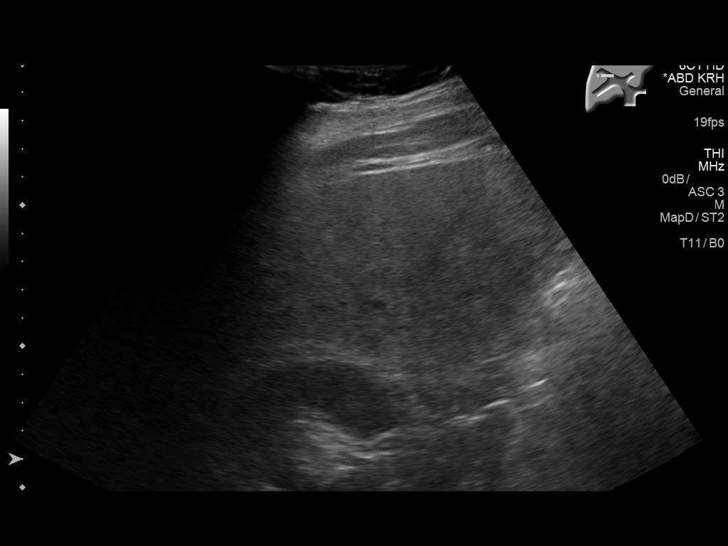
[im 43/74]
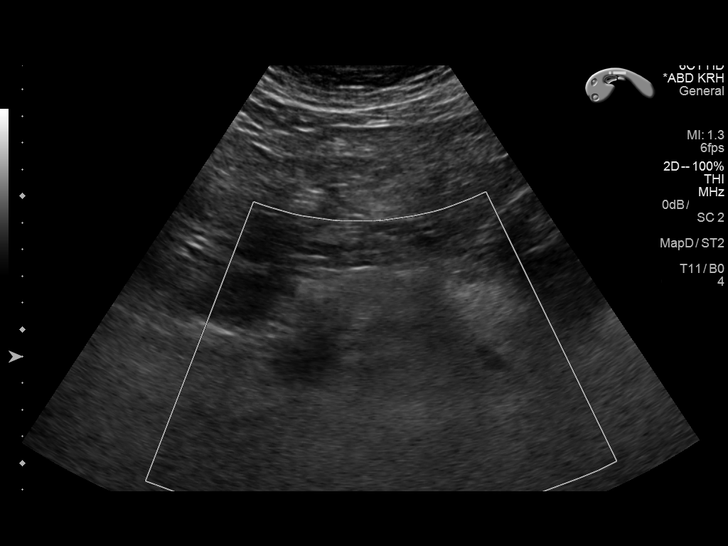
[im 49/74]
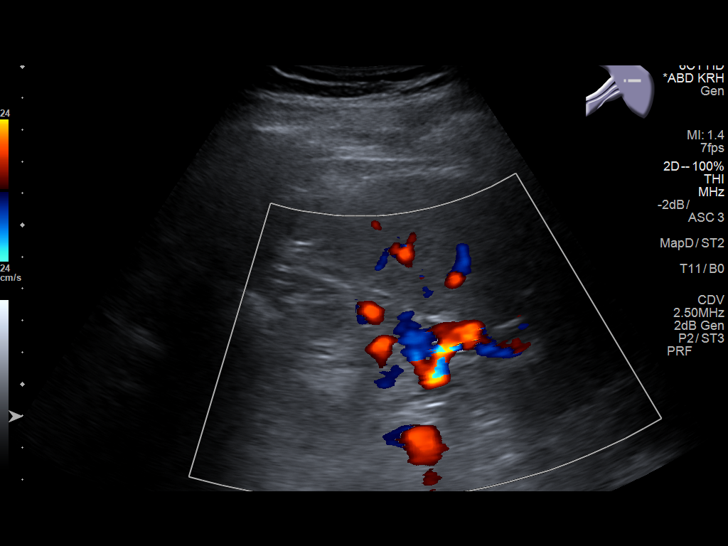
[im 55/74]
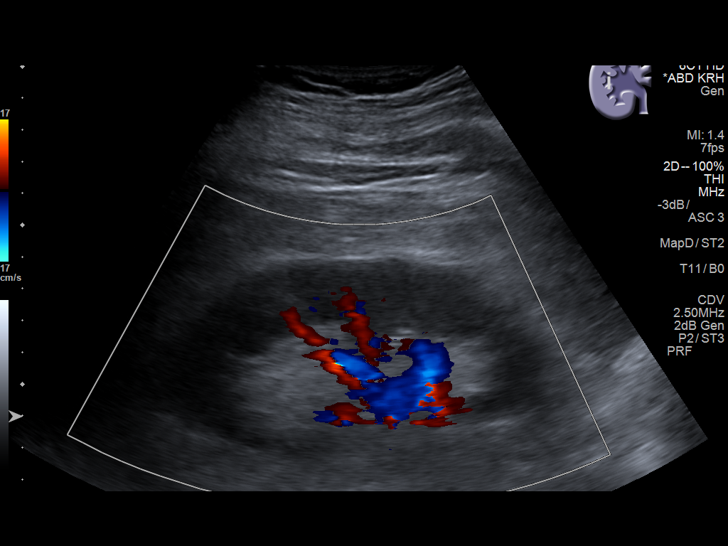
[im 61/74]
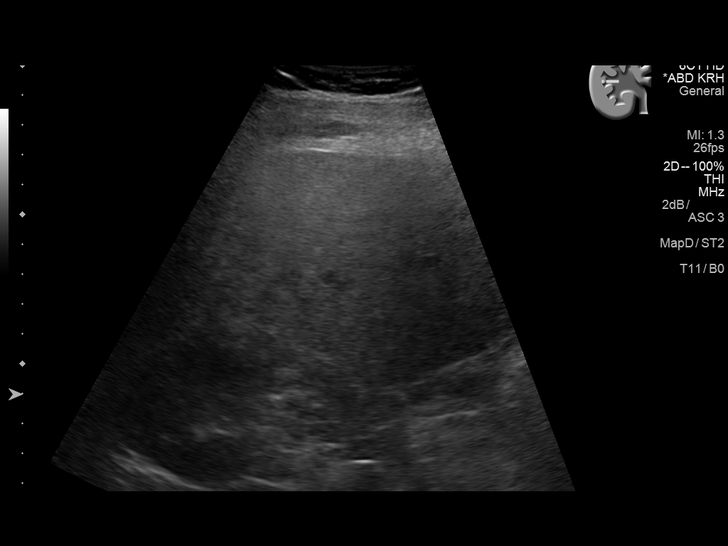
[im 67/74]
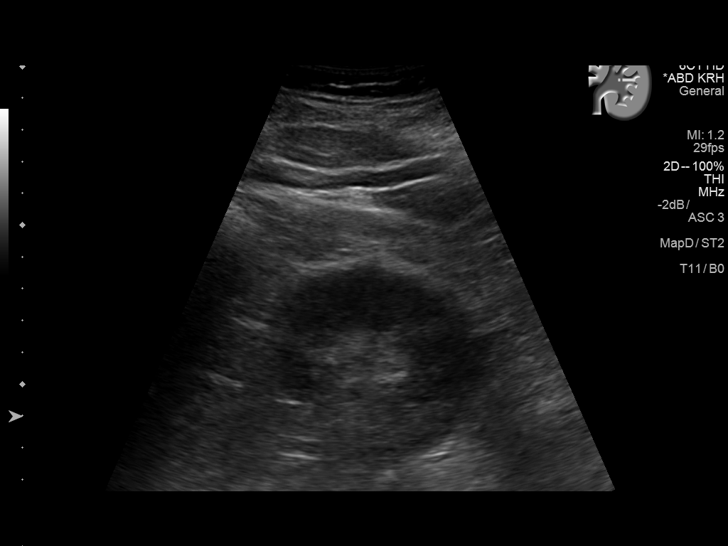
[im 74/74]
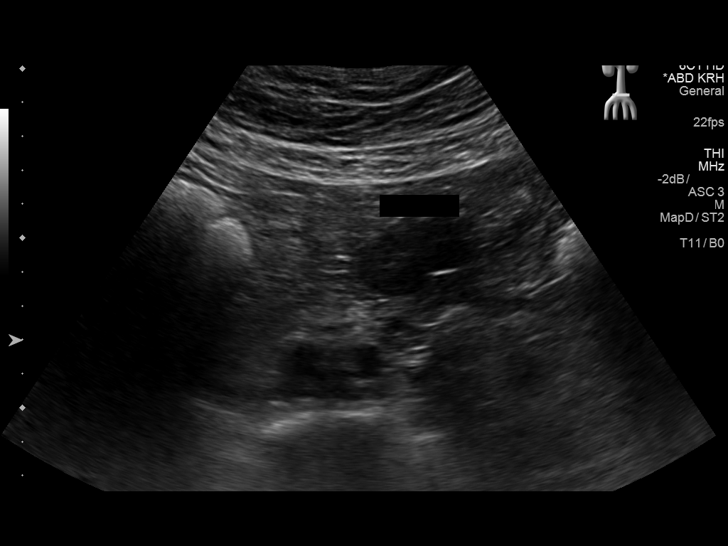

[13 of 25 positions shown; findings below may reference images not displayed]

FINDINGS: Gallbladder: 6 mm echogenic focus without posterior wall shadowing.
This focus is non mobile. This could be an adherent non shadowing
stone or tiny polyp. Gallbladder wall thickness is 3 mm. Negative
Murphy sign. No pericholecystic fluid collection.

Common bile duct: Diameter: 2 mm

Liver: Coarse hepatic echotexture consistent with active
hepatocellular disease and/or fatty infiltration. No focal hepatic
abnormality.

IVC: No abnormality visualized.

Pancreas: Visualized portion unremarkable.

Spleen: Size and appearance within normal limits.

Right Kidney: Length: 11.9 cm. Echogenicity within normal limits. No
mass or hydronephrosis visualized.

Left Kidney: Length: 12.7 cm. Echogenicity within normal limits.
Lobulated contour suggesting scarring. No mass or hydronephrosis
visualized.

Abdominal aorta: Mild abdominal aortic ectasia 2.9 cm.

Other findings: None.
IMPRESSION: 1. 6 mm echogenic focus without posterior wall shadowing. The focus
is non mobile. This could be an adherent non mild stone or tiny
polyp. No biliary distention .
2. Coarse hepatic echotexture consistent with hepatocellular disease
and/or fatty infiltration. No focal hepatic abnormality.
3. Lobulated left renal contour suggesting scarring.
4. Mild abdominal aortic ectasia 2.9 cm. Ectatic abdominal aorta at
risk for aneurysm development. Recommend followup by ultrasound in 5
years. This recommendation follows ACR consensus guidelines: White
Paper of the ACR Incidental Findings Committee II on Vascular
Findings. [HOSPITAL] 9097; [DATE].

## 2016-03-11 DIAGNOSIS — E1140 Type 2 diabetes mellitus with diabetic neuropathy, unspecified: Secondary | ICD-10-CM | POA: Diagnosis not present

## 2016-03-11 DIAGNOSIS — M545 Low back pain: Secondary | ICD-10-CM | POA: Diagnosis not present

## 2016-03-11 DIAGNOSIS — F419 Anxiety disorder, unspecified: Secondary | ICD-10-CM | POA: Diagnosis not present

## 2016-03-11 DIAGNOSIS — Z7984 Long term (current) use of oral hypoglycemic drugs: Secondary | ICD-10-CM | POA: Diagnosis not present

## 2016-04-01 DIAGNOSIS — N529 Male erectile dysfunction, unspecified: Secondary | ICD-10-CM | POA: Diagnosis not present

## 2016-04-01 DIAGNOSIS — I10 Essential (primary) hypertension: Secondary | ICD-10-CM | POA: Diagnosis not present

## 2016-04-01 DIAGNOSIS — E785 Hyperlipidemia, unspecified: Secondary | ICD-10-CM | POA: Diagnosis not present

## 2016-04-01 DIAGNOSIS — E1140 Type 2 diabetes mellitus with diabetic neuropathy, unspecified: Secondary | ICD-10-CM | POA: Diagnosis not present

## 2016-04-01 DIAGNOSIS — Z23 Encounter for immunization: Secondary | ICD-10-CM | POA: Diagnosis not present

## 2016-04-01 DIAGNOSIS — E669 Obesity, unspecified: Secondary | ICD-10-CM | POA: Diagnosis not present

## 2016-04-01 DIAGNOSIS — F419 Anxiety disorder, unspecified: Secondary | ICD-10-CM | POA: Diagnosis not present

## 2016-04-01 DIAGNOSIS — M109 Gout, unspecified: Secondary | ICD-10-CM | POA: Diagnosis not present

## 2016-04-01 DIAGNOSIS — G4700 Insomnia, unspecified: Secondary | ICD-10-CM | POA: Diagnosis not present

## 2016-04-29 DIAGNOSIS — E789 Disorder of lipoprotein metabolism, unspecified: Secondary | ICD-10-CM | POA: Diagnosis not present

## 2016-04-29 DIAGNOSIS — E118 Type 2 diabetes mellitus with unspecified complications: Secondary | ICD-10-CM | POA: Diagnosis not present

## 2016-05-06 DIAGNOSIS — M542 Cervicalgia: Secondary | ICD-10-CM | POA: Diagnosis not present

## 2016-05-06 DIAGNOSIS — E118 Type 2 diabetes mellitus with unspecified complications: Secondary | ICD-10-CM | POA: Diagnosis not present

## 2016-05-06 DIAGNOSIS — G629 Polyneuropathy, unspecified: Secondary | ICD-10-CM | POA: Diagnosis not present

## 2016-05-22 DIAGNOSIS — E7800 Pure hypercholesterolemia, unspecified: Secondary | ICD-10-CM | POA: Diagnosis not present

## 2016-05-22 DIAGNOSIS — E789 Disorder of lipoprotein metabolism, unspecified: Secondary | ICD-10-CM | POA: Diagnosis not present

## 2016-07-03 DIAGNOSIS — E1140 Type 2 diabetes mellitus with diabetic neuropathy, unspecified: Secondary | ICD-10-CM | POA: Diagnosis not present

## 2016-07-03 DIAGNOSIS — Z01818 Encounter for other preprocedural examination: Secondary | ICD-10-CM | POA: Diagnosis not present

## 2016-07-03 DIAGNOSIS — E785 Hyperlipidemia, unspecified: Secondary | ICD-10-CM | POA: Diagnosis not present

## 2016-07-09 DIAGNOSIS — E041 Nontoxic single thyroid nodule: Secondary | ICD-10-CM | POA: Diagnosis not present

## 2016-07-09 DIAGNOSIS — E789 Disorder of lipoprotein metabolism, unspecified: Secondary | ICD-10-CM | POA: Diagnosis not present

## 2016-07-09 DIAGNOSIS — G4733 Obstructive sleep apnea (adult) (pediatric): Secondary | ICD-10-CM | POA: Diagnosis not present

## 2016-07-09 DIAGNOSIS — E118 Type 2 diabetes mellitus with unspecified complications: Secondary | ICD-10-CM | POA: Diagnosis not present

## 2016-07-16 DIAGNOSIS — E789 Disorder of lipoprotein metabolism, unspecified: Secondary | ICD-10-CM | POA: Diagnosis not present

## 2016-07-16 DIAGNOSIS — G629 Polyneuropathy, unspecified: Secondary | ICD-10-CM | POA: Diagnosis not present

## 2016-07-16 DIAGNOSIS — E118 Type 2 diabetes mellitus with unspecified complications: Secondary | ICD-10-CM | POA: Diagnosis not present

## 2016-08-11 DIAGNOSIS — H401121 Primary open-angle glaucoma, left eye, mild stage: Secondary | ICD-10-CM | POA: Diagnosis not present

## 2016-08-11 DIAGNOSIS — H40011 Open angle with borderline findings, low risk, right eye: Secondary | ICD-10-CM | POA: Diagnosis not present

## 2016-08-11 DIAGNOSIS — H04123 Dry eye syndrome of bilateral lacrimal glands: Secondary | ICD-10-CM | POA: Diagnosis not present

## 2016-10-09 DIAGNOSIS — E789 Disorder of lipoprotein metabolism, unspecified: Secondary | ICD-10-CM | POA: Diagnosis not present

## 2016-10-09 DIAGNOSIS — E118 Type 2 diabetes mellitus with unspecified complications: Secondary | ICD-10-CM | POA: Diagnosis not present

## 2016-10-09 DIAGNOSIS — E0789 Other specified disorders of thyroid: Secondary | ICD-10-CM | POA: Diagnosis not present

## 2016-10-16 DIAGNOSIS — E118 Type 2 diabetes mellitus with unspecified complications: Secondary | ICD-10-CM | POA: Diagnosis not present

## 2016-10-16 DIAGNOSIS — E032 Hypothyroidism due to medicaments and other exogenous substances: Secondary | ICD-10-CM | POA: Diagnosis not present

## 2016-10-16 DIAGNOSIS — G4733 Obstructive sleep apnea (adult) (pediatric): Secondary | ICD-10-CM | POA: Diagnosis not present

## 2016-10-30 DIAGNOSIS — M109 Gout, unspecified: Secondary | ICD-10-CM | POA: Diagnosis not present

## 2016-10-30 DIAGNOSIS — G4733 Obstructive sleep apnea (adult) (pediatric): Secondary | ICD-10-CM | POA: Diagnosis not present

## 2016-10-30 DIAGNOSIS — I10 Essential (primary) hypertension: Secondary | ICD-10-CM | POA: Diagnosis not present

## 2016-10-30 DIAGNOSIS — E785 Hyperlipidemia, unspecified: Secondary | ICD-10-CM | POA: Diagnosis not present

## 2016-10-30 DIAGNOSIS — F411 Generalized anxiety disorder: Secondary | ICD-10-CM | POA: Diagnosis not present

## 2016-10-30 DIAGNOSIS — K219 Gastro-esophageal reflux disease without esophagitis: Secondary | ICD-10-CM | POA: Diagnosis not present

## 2016-10-30 DIAGNOSIS — E039 Hypothyroidism, unspecified: Secondary | ICD-10-CM | POA: Diagnosis not present

## 2016-10-30 DIAGNOSIS — E1140 Type 2 diabetes mellitus with diabetic neuropathy, unspecified: Secondary | ICD-10-CM | POA: Diagnosis not present

## 2016-10-30 DIAGNOSIS — Z0000 Encounter for general adult medical examination without abnormal findings: Secondary | ICD-10-CM | POA: Diagnosis not present

## 2016-10-30 DIAGNOSIS — G4700 Insomnia, unspecified: Secondary | ICD-10-CM | POA: Diagnosis not present

## 2016-10-30 DIAGNOSIS — N529 Male erectile dysfunction, unspecified: Secondary | ICD-10-CM | POA: Diagnosis not present

## 2016-12-10 DIAGNOSIS — M4326 Fusion of spine, lumbar region: Secondary | ICD-10-CM | POA: Diagnosis not present

## 2016-12-10 DIAGNOSIS — M791 Myalgia: Secondary | ICD-10-CM | POA: Diagnosis not present

## 2016-12-10 DIAGNOSIS — M545 Low back pain: Secondary | ICD-10-CM | POA: Diagnosis not present

## 2016-12-23 DIAGNOSIS — E118 Type 2 diabetes mellitus with unspecified complications: Secondary | ICD-10-CM | POA: Diagnosis not present

## 2016-12-23 DIAGNOSIS — E041 Nontoxic single thyroid nodule: Secondary | ICD-10-CM | POA: Diagnosis not present

## 2016-12-23 DIAGNOSIS — Z125 Encounter for screening for malignant neoplasm of prostate: Secondary | ICD-10-CM | POA: Diagnosis not present

## 2016-12-23 DIAGNOSIS — N390 Urinary tract infection, site not specified: Secondary | ICD-10-CM | POA: Diagnosis not present

## 2016-12-23 DIAGNOSIS — E0789 Other specified disorders of thyroid: Secondary | ICD-10-CM | POA: Diagnosis not present

## 2016-12-23 DIAGNOSIS — Z0000 Encounter for general adult medical examination without abnormal findings: Secondary | ICD-10-CM | POA: Diagnosis not present

## 2016-12-23 DIAGNOSIS — E789 Disorder of lipoprotein metabolism, unspecified: Secondary | ICD-10-CM | POA: Diagnosis not present

## 2016-12-30 DIAGNOSIS — E789 Disorder of lipoprotein metabolism, unspecified: Secondary | ICD-10-CM | POA: Diagnosis not present

## 2016-12-30 DIAGNOSIS — M109 Gout, unspecified: Secondary | ICD-10-CM | POA: Diagnosis not present

## 2016-12-30 DIAGNOSIS — M549 Dorsalgia, unspecified: Secondary | ICD-10-CM | POA: Diagnosis not present

## 2016-12-30 DIAGNOSIS — E118 Type 2 diabetes mellitus with unspecified complications: Secondary | ICD-10-CM | POA: Diagnosis not present

## 2016-12-30 DIAGNOSIS — L0390 Cellulitis, unspecified: Secondary | ICD-10-CM | POA: Diagnosis not present

## 2016-12-31 DIAGNOSIS — Z0000 Encounter for general adult medical examination without abnormal findings: Secondary | ICD-10-CM | POA: Diagnosis not present

## 2017-01-27 DIAGNOSIS — H353131 Nonexudative age-related macular degeneration, bilateral, early dry stage: Secondary | ICD-10-CM | POA: Diagnosis not present

## 2017-01-27 DIAGNOSIS — H35372 Puckering of macula, left eye: Secondary | ICD-10-CM | POA: Diagnosis not present

## 2017-01-27 DIAGNOSIS — H40011 Open angle with borderline findings, low risk, right eye: Secondary | ICD-10-CM | POA: Diagnosis not present

## 2017-01-27 DIAGNOSIS — H401121 Primary open-angle glaucoma, left eye, mild stage: Secondary | ICD-10-CM | POA: Diagnosis not present

## 2017-03-10 DIAGNOSIS — H40052 Ocular hypertension, left eye: Secondary | ICD-10-CM | POA: Diagnosis not present

## 2017-03-10 DIAGNOSIS — H04123 Dry eye syndrome of bilateral lacrimal glands: Secondary | ICD-10-CM | POA: Diagnosis not present

## 2017-03-10 DIAGNOSIS — H40011 Open angle with borderline findings, low risk, right eye: Secondary | ICD-10-CM | POA: Diagnosis not present

## 2017-03-10 DIAGNOSIS — H401121 Primary open-angle glaucoma, left eye, mild stage: Secondary | ICD-10-CM | POA: Diagnosis not present

## 2017-05-04 DIAGNOSIS — F411 Generalized anxiety disorder: Secondary | ICD-10-CM | POA: Diagnosis not present

## 2017-05-04 DIAGNOSIS — Z23 Encounter for immunization: Secondary | ICD-10-CM | POA: Diagnosis not present

## 2017-05-04 DIAGNOSIS — Z7984 Long term (current) use of oral hypoglycemic drugs: Secondary | ICD-10-CM | POA: Diagnosis not present

## 2017-05-04 DIAGNOSIS — M109 Gout, unspecified: Secondary | ICD-10-CM | POA: Diagnosis not present

## 2017-05-04 DIAGNOSIS — Z79899 Other long term (current) drug therapy: Secondary | ICD-10-CM | POA: Diagnosis not present

## 2017-05-04 DIAGNOSIS — E039 Hypothyroidism, unspecified: Secondary | ICD-10-CM | POA: Diagnosis not present

## 2017-05-04 DIAGNOSIS — K219 Gastro-esophageal reflux disease without esophagitis: Secondary | ICD-10-CM | POA: Diagnosis not present

## 2017-05-04 DIAGNOSIS — E785 Hyperlipidemia, unspecified: Secondary | ICD-10-CM | POA: Diagnosis not present

## 2017-05-04 DIAGNOSIS — I10 Essential (primary) hypertension: Secondary | ICD-10-CM | POA: Diagnosis not present

## 2017-05-04 DIAGNOSIS — E1140 Type 2 diabetes mellitus with diabetic neuropathy, unspecified: Secondary | ICD-10-CM | POA: Diagnosis not present

## 2017-05-04 DIAGNOSIS — G4733 Obstructive sleep apnea (adult) (pediatric): Secondary | ICD-10-CM | POA: Diagnosis not present

## 2017-05-26 DIAGNOSIS — K648 Other hemorrhoids: Secondary | ICD-10-CM | POA: Diagnosis not present

## 2017-05-26 DIAGNOSIS — K635 Polyp of colon: Secondary | ICD-10-CM | POA: Diagnosis not present

## 2017-05-26 DIAGNOSIS — D126 Benign neoplasm of colon, unspecified: Secondary | ICD-10-CM | POA: Diagnosis not present

## 2017-05-26 DIAGNOSIS — K5730 Diverticulosis of large intestine without perforation or abscess without bleeding: Secondary | ICD-10-CM | POA: Diagnosis not present

## 2017-05-26 DIAGNOSIS — Z86010 Personal history of colonic polyps: Secondary | ICD-10-CM | POA: Diagnosis not present

## 2017-05-28 DIAGNOSIS — D126 Benign neoplasm of colon, unspecified: Secondary | ICD-10-CM | POA: Diagnosis not present

## 2017-05-28 DIAGNOSIS — K635 Polyp of colon: Secondary | ICD-10-CM | POA: Diagnosis not present

## 2017-07-01 DIAGNOSIS — E041 Nontoxic single thyroid nodule: Secondary | ICD-10-CM | POA: Diagnosis not present

## 2017-07-01 DIAGNOSIS — E7800 Pure hypercholesterolemia, unspecified: Secondary | ICD-10-CM | POA: Diagnosis not present

## 2017-07-01 DIAGNOSIS — E785 Hyperlipidemia, unspecified: Secondary | ICD-10-CM | POA: Diagnosis not present

## 2017-07-01 DIAGNOSIS — I10 Essential (primary) hypertension: Secondary | ICD-10-CM | POA: Diagnosis not present

## 2017-07-01 DIAGNOSIS — E039 Hypothyroidism, unspecified: Secondary | ICD-10-CM | POA: Diagnosis not present

## 2017-07-01 DIAGNOSIS — E118 Type 2 diabetes mellitus with unspecified complications: Secondary | ICD-10-CM | POA: Diagnosis not present

## 2017-07-06 DIAGNOSIS — E1140 Type 2 diabetes mellitus with diabetic neuropathy, unspecified: Secondary | ICD-10-CM | POA: Diagnosis not present

## 2017-07-06 DIAGNOSIS — E039 Hypothyroidism, unspecified: Secondary | ICD-10-CM | POA: Diagnosis not present

## 2017-07-06 DIAGNOSIS — E782 Mixed hyperlipidemia: Secondary | ICD-10-CM | POA: Diagnosis not present

## 2017-07-06 DIAGNOSIS — E890 Postprocedural hypothyroidism: Secondary | ICD-10-CM | POA: Diagnosis not present

## 2017-07-08 DIAGNOSIS — G4733 Obstructive sleep apnea (adult) (pediatric): Secondary | ICD-10-CM | POA: Diagnosis not present

## 2017-07-21 DIAGNOSIS — H43392 Other vitreous opacities, left eye: Secondary | ICD-10-CM | POA: Diagnosis not present

## 2017-07-21 DIAGNOSIS — H40052 Ocular hypertension, left eye: Secondary | ICD-10-CM | POA: Diagnosis not present

## 2017-07-21 DIAGNOSIS — H40011 Open angle with borderline findings, low risk, right eye: Secondary | ICD-10-CM | POA: Diagnosis not present

## 2017-07-21 DIAGNOSIS — H401121 Primary open-angle glaucoma, left eye, mild stage: Secondary | ICD-10-CM | POA: Diagnosis not present

## 2017-07-24 ENCOUNTER — Emergency Department (HOSPITAL_BASED_OUTPATIENT_CLINIC_OR_DEPARTMENT_OTHER)
Admission: EM | Admit: 2017-07-24 | Discharge: 2017-07-24 | Disposition: A | Discharge status: Home / Self Care | Payer: Medicare Other | Attending: Emergency Medicine | Admitting: Emergency Medicine

## 2017-07-24 ENCOUNTER — Emergency Department (HOSPITAL_BASED_OUTPATIENT_CLINIC_OR_DEPARTMENT_OTHER): Payer: Medicare Other

## 2017-07-24 ENCOUNTER — Other Ambulatory Visit: Payer: Self-pay

## 2017-07-24 ENCOUNTER — Encounter (HOSPITAL_BASED_OUTPATIENT_CLINIC_OR_DEPARTMENT_OTHER): Payer: Self-pay

## 2017-07-24 DIAGNOSIS — Y929 Unspecified place or not applicable: Secondary | ICD-10-CM | POA: Insufficient documentation

## 2017-07-24 DIAGNOSIS — Z7982 Long term (current) use of aspirin: Secondary | ICD-10-CM | POA: Insufficient documentation

## 2017-07-24 DIAGNOSIS — Y999 Unspecified external cause status: Secondary | ICD-10-CM | POA: Diagnosis not present

## 2017-07-24 DIAGNOSIS — S39012A Strain of muscle, fascia and tendon of lower back, initial encounter: Secondary | ICD-10-CM | POA: Insufficient documentation

## 2017-07-24 DIAGNOSIS — Z79899 Other long term (current) drug therapy: Secondary | ICD-10-CM | POA: Diagnosis not present

## 2017-07-24 DIAGNOSIS — Y939 Activity, unspecified: Secondary | ICD-10-CM | POA: Insufficient documentation

## 2017-07-24 DIAGNOSIS — R109 Unspecified abdominal pain: Secondary | ICD-10-CM | POA: Diagnosis not present

## 2017-07-24 DIAGNOSIS — E119 Type 2 diabetes mellitus without complications: Secondary | ICD-10-CM | POA: Diagnosis not present

## 2017-07-24 DIAGNOSIS — I10 Essential (primary) hypertension: Secondary | ICD-10-CM | POA: Diagnosis not present

## 2017-07-24 DIAGNOSIS — Z7984 Long term (current) use of oral hypoglycemic drugs: Secondary | ICD-10-CM | POA: Diagnosis not present

## 2017-07-24 DIAGNOSIS — T148XXA Other injury of unspecified body region, initial encounter: Secondary | ICD-10-CM

## 2017-07-24 DIAGNOSIS — X58XXXA Exposure to other specified factors, initial encounter: Secondary | ICD-10-CM | POA: Diagnosis not present

## 2017-07-24 DIAGNOSIS — R51 Headache: Secondary | ICD-10-CM | POA: Diagnosis not present

## 2017-07-24 DIAGNOSIS — R1011 Right upper quadrant pain: Secondary | ICD-10-CM | POA: Diagnosis present

## 2017-07-24 HISTORY — DX: Malignant (primary) neoplasm, unspecified: C80.1

## 2017-07-24 LAB — CBC WITH DIFFERENTIAL/PLATELET
Basophils Absolute: 0 10*3/uL (ref 0.0–0.1)
Basophils Relative: 1 %
Eosinophils Absolute: 0.1 10*3/uL (ref 0.0–0.7)
Eosinophils Relative: 1 %
HCT: 40.3 % (ref 39.0–52.0)
HEMOGLOBIN: 13.6 g/dL (ref 13.0–17.0)
LYMPHS ABS: 1.3 10*3/uL (ref 0.7–4.0)
LYMPHS PCT: 21 %
MCH: 30.3 pg (ref 26.0–34.0)
MCHC: 33.7 g/dL (ref 30.0–36.0)
MCV: 89.8 fL (ref 78.0–100.0)
Monocytes Absolute: 1.2 10*3/uL — ABNORMAL HIGH (ref 0.1–1.0)
Monocytes Relative: 19 %
Neutro Abs: 3.6 10*3/uL (ref 1.7–7.7)
Neutrophils Relative %: 58 %
Platelets: 182 10*3/uL (ref 150–400)
RBC: 4.49 MIL/uL (ref 4.22–5.81)
RDW: 14.2 % (ref 11.5–15.5)
WBC: 6.2 10*3/uL (ref 4.0–10.5)

## 2017-07-24 LAB — BASIC METABOLIC PANEL
ANION GAP: 10 (ref 5–15)
BUN: 15 mg/dL (ref 6–20)
CO2: 22 mmol/L (ref 22–32)
Calcium: 9.1 mg/dL (ref 8.9–10.3)
Chloride: 103 mmol/L (ref 101–111)
Creatinine, Ser: 0.85 mg/dL (ref 0.61–1.24)
GFR calc Af Amer: >60 mL/min (ref >60)
GFR calc non Af Amer: >60 mL/min (ref >60)
Glucose, Bld: 140 mg/dL — ABNORMAL HIGH (ref 65–99)
POTASSIUM: 3.6 mmol/L (ref 3.5–5.1)
SODIUM: 135 mmol/L (ref 135–145)

## 2017-07-24 LAB — URINALYSIS, ROUTINE W REFLEX MICROSCOPIC
Bilirubin Urine: NEGATIVE
Glucose, UA: NEGATIVE mg/dL
HGB URINE DIPSTICK: NEGATIVE
Ketones, ur: NEGATIVE mg/dL
LEUKOCYTES UA: NEGATIVE
Nitrite: NEGATIVE
Protein, ur: NEGATIVE mg/dL
Specific Gravity, Urine: >1.03 — ABNORMAL HIGH (ref 1.005–1.030)
pH: 5.5 (ref 5.0–8.0)

## 2017-07-24 MED ORDER — HYDROCODONE-ACETAMINOPHEN 5-325 MG PO TABS
2.0000 | ORAL_TABLET | Freq: Once | ORAL | Status: AC
Start: 2017-07-24 — End: 2017-07-24
  Administered 2017-07-24: 2 via ORAL
  Filled 2017-07-24: qty 2

## 2017-07-24 MED ORDER — HYDROCODONE-ACETAMINOPHEN 5-325 MG PO TABS
1.0000 | ORAL_TABLET | Freq: Four times a day (QID) | ORAL | 0 refills | Status: DC | PRN
Start: 2017-07-24 — End: 2023-06-02

## 2017-07-24 MED ORDER — HYDROMORPHONE HCL 1 MG/ML IJ SOLN
1.0000 mg | Freq: Once | INTRAMUSCULAR | Status: AC
  Administered 2017-07-24: 1 mg via INTRAVENOUS
  Filled 2017-07-24: qty 1

## 2017-07-24 MED ORDER — HYDROCODONE-ACETAMINOPHEN 5-325 MG PO TABS: 1.0000 | ORAL_TABLET | Freq: Once | ORAL | Status: DC

## 2017-07-24 MED ORDER — IOPAMIDOL (ISOVUE-370) INJECTION 76%
100.0000 mL | Freq: Once | INTRAVENOUS | Status: AC | PRN
  Administered 2017-07-24: 100 mL via INTRAVENOUS

## 2017-07-24 MED FILL — HYDROCODON-APAP 5-325: 5-325 | 2 days supply | Qty: 6 | Fill #0

## 2017-07-24 NOTE — ED Provider Notes (Signed)
[facility] EMERGENCY DEPARTMENT Provider Note   CSN: [id number] Arrival date & time: 07/24/17  1059     History   Chief Complaint Chief Complaint  Patient presents with  . Flank Pain    HPI LIEV BROCKBANK is a 66 y.o. male personal history of diabetes, hypertension who presents for evaluation of right lateral flank pain that began yesterday.  Patient reports that he first noticed it as he was in the car riding and stated that he did not want to go into the store with his wife because it hurt too much.  Patient reports that since then the bilateral flank pain has been constant.  He reports that it significantly worsened last night.  He has not taking medications for the symptoms.  States that pain is worsened with movement.  He states that he has been taking lemon and oil.  He states that he does not have a history of diagnosed kidney stones but states that he has had similar pain in the past.  He only states lemon juice and oil and has resolving of symptoms.  Son reports that he has a history of kidney stones.  Patient reports some nausea last night but denies any vomiting.  On ED arrival, patient complains of a minor headache.  Patient denies any fever, chest pain, difficulty breathing, abdominal pain, vomiting or dysuria, hematuria.  The history is provided by the patient and the spouse.    Past Medical History:  Diagnosis Date  . Allergy   . Anxiety   . Arthritis   . Cancer ([place])   . Complication of anesthesia    with back surgery-too much Morphine,O2 too low  . Diabetes mellitus without complication ([place])   . H/O hiatal hernia   . Hyperlipidemia   . Hypertension   . Sleep apnea    setting -18  . Thyroid disease     Patient Active Problem List   Diagnosis Date Noted  . Thyroiditis, lymphocytic 07/21/2013  . Papillary thyroid carcinoma, follicular variant, 21mm, right lobe 07/21/2013    Past Surgical History:  Procedure Laterality Date  . BACK SURGERY      . JOINT REPLACEMENT     left and right knee  . SHOULDER SURGERY    . THYROID LOBECTOMY Right 06/23/2013   Procedure: THYROID LOBECTOMY;  Surgeon: Earnstine Regal, MD;  Location: [facility];  Service: General;  Laterality: Right;  . TONSILLECTOMY     as child       Home Medications    Prior to Admission medications   Medication Sig Start Date End Date Taking? Authorizing Provider  aspirin 81 MG tablet Take 81 mg by mouth daily.    [provider]  CELEBREX 200 MG capsule Take 200 mg by mouth once a week.  03/30/13   [provider]  diazepam (VALIUM) 5 MG tablet Take 5 mg by mouth every 6 (six) hours as needed for anxiety.    [provider]  enalapril-hydrochlorothiazide (VASERETIC) 10-25 MG per tablet Take 1 tablet by mouth daily.    [provider]  FLUoxetine (PROZAC) 40 MG capsule Take 40 mg by mouth daily.  04/21/13   [provider]  HYDROcodone-acetaminophen (NORCO/VICODIN) 5-325 MG tablet Take 1-2 tablets by mouth every 6 (six) hours as needed. 07/24/17   Volanda Napoleon, PA-C  levothyroxine (SYNTHROID, LEVOTHROID) 25 MCG tablet Take 25 mcg by mouth daily before breakfast.    [provider]  loratadine (CLARITIN) 10 MG  tablet Take 10 mg by mouth daily.    [provider]  metFORMIN (GLUCOPHAGE) 500 MG tablet Take 500 mg by mouth 2 (two) times daily with a meal.    [provider]  predniSONE (DELTASONE) 20 MG tablet Take 20 mg by mouth daily with breakfast. Take 3 tablets x 3 days, 2 tablets x 3 days, 1 tablet x 3 days once a day orally for 9 days 06/10/13   [provider]  pregabalin (LYRICA) 50 MG capsule Take 50 mg by mouth 2 (two) times a week.    [provider]  rosuvastatin (CRESTOR) 20 MG tablet Take 10 mg by mouth daily.     [provider]  zolpidem (AMBIEN) 10 MG tablet Take 10 mg by mouth at bedtime as needed for sleep.    [provider]    Family History No  family history on file.  Social History Social History   Tobacco Use  . Smoking status: Never Smoker  . Smokeless tobacco: Never Used  Substance Use Topics  . Alcohol use: Yes    Comment: occassionally  . Drug use: No     Allergies   Patient has no known allergies.   Review of Systems Review of Systems  Constitutional: Negative for fever.  Respiratory: Negative for cough and shortness of breath.   Cardiovascular: Negative for chest pain.  Gastrointestinal: Positive for nausea. Negative for abdominal pain and vomiting.  Genitourinary: Positive for flank pain. Negative for dysuria and hematuria.  Neurological: Positive for headaches.     Physical Exam Updated Vital Signs BP (!) 139/92 (BP Location: Left Arm)   Pulse 86   Temp 98.3 F (36.8 C) (Oral)   Resp 20   Ht 5\' 8"  (1.727 m)   Wt 102.1 kg (225 lb)   SpO2 100%   BMI 34.21 kg/m   Physical Exam  Constitutional: He is oriented to person, place, and time. He appears well-developed and well-nourished.  Appears uncomfortable but no acute distress   HENT:  Head: Normocephalic and atraumatic.  Mouth/Throat: Oropharynx is clear and moist and mucous membranes are normal.  Eyes: Conjunctivae, EOM and lids are normal. Pupils are equal, round, and reactive to light.  Neck: Full passive range of motion without pain.  Cardiovascular: Normal rate, regular rhythm, normal heart sounds and normal pulses. Exam reveals no gallop and no friction rub.  No murmur heard. Pulmonary/Chest: Effort normal and breath sounds normal.  Abdominal: Soft. Normal appearance. There is no tenderness. There is CVA tenderness. There is no rigidity and no guarding.  Bilateral CVA tenderness that extends to the bilateral flanks.   Musculoskeletal: Normal range of motion.       Arms: Diffuse muscular tenderness over the paraspinal muscles of the lumbar region bilaterally.  No midline bony tenderness.  Neurological: He is alert and oriented to  person, place, and time.  Cranial nerves III-XII intact Follows commands, Moves all extremities  5/5 strength to BUE and BLE  Sensation intact throughout all major nerve distributions Normal finger to nose. No dysdiadochokinesia. No pronator drift. No gait abnormalities  No slurred speech. No facial droop.   Skin: Skin is warm and dry. Capillary refill takes less than 2 seconds.  Psychiatric: He has a normal mood and affect. His speech is normal.  Nursing note and vitals reviewed.    ED Treatments / Results  Labs (all labs ordered are listed, but only abnormal results are displayed) Labs Reviewed  BASIC METABOLIC PANEL - Abnormal;  Notable for the following components:      Result Value   Glucose, Bld 140 (*)    All other components within normal limits  CBC WITH DIFFERENTIAL/PLATELET - Abnormal; Notable for the following components:   Monocytes Absolute 1.2 (*)    All other components within normal limits  URINALYSIS, ROUTINE W REFLEX MICROSCOPIC - Abnormal; Notable for the following components:   Specific Gravity, Urine >1.030 (*)    All other components within normal limits    EKG  EKG Interpretation None       Radiology Ct Angio Chest/abd/pel For Dissection W And/or Wo Contrast  Result Date: 07/24/2017 CLINICAL DATA:  Acute bilateral flank pain. EXAM: CT ANGIOGRAPHY CHEST, ABDOMEN AND PELVIS TECHNIQUE: Multidetector CT imaging through the chest, abdomen and pelvis was performed using the standard protocol during bolus administration of intravenous contrast. Multiplanar reconstructed images and MIPs were obtained and reviewed to evaluate the vascular anatomy. CONTRAST:  165mL ISOVUE-370 IOPAMIDOL (ISOVUE-370) INJECTION 76% COMPARISON:  None. FINDINGS: CTA CHEST FINDINGS Cardiovascular: Atherosclerosis of thoracic aorta is noted without aneurysm or dissection. Normal cardiac size. No pericardial effusion. Mediastinum/Nodes: No enlarged mediastinal, hilar, or axillary lymph  nodes. Trachea and esophagus demonstrate no significant findings. Status post right thyroidectomy. Lungs/Pleura: Lungs are clear. No pleural effusion or pneumothorax. Musculoskeletal: No chest wall abnormality. No acute or significant osseous findings. Review of the MIP images confirms the above findings. CTA ABDOMEN AND PELVIS FINDINGS VASCULAR Aorta: Normal caliber aorta without aneurysm, dissection, vasculitis or significant stenosis. Celiac: Patent without evidence of aneurysm, dissection, vasculitis or significant stenosis. SMA: Patent without evidence of aneurysm, dissection, vasculitis or significant stenosis. Renals: Both renal arteries are patent without evidence of aneurysm, dissection, vasculitis, fibromuscular dysplasia or significant stenosis. IMA: Patent without evidence of aneurysm, dissection, vasculitis or significant stenosis. Inflow: Patent without evidence of aneurysm, dissection, vasculitis or significant stenosis. Veins: No obvious venous abnormality within the limitations of this arterial phase study. Review of the MIP images confirms the above findings. NON-VASCULAR Hepatobiliary: Small solitary gallstone is noted without inflammation. The liver is unremarkable. Pancreas: Unremarkable. No pancreatic ductal dilatation or surrounding inflammatory changes. Spleen: Normal in size without focal abnormality. Adrenals/Urinary Tract: Adrenal glands are unremarkable. Kidneys are normal, without renal calculi, focal lesion, or hydronephrosis. Bladder is unremarkable. Stomach/Bowel: Stomach is within normal limits. Appendix appears normal. No evidence of bowel wall thickening, distention, or inflammatory changes. Lymphatic: No significant vascular findings are present. No enlarged abdominal or pelvic lymph nodes. Reproductive: Prostate is unremarkable. Other: No abdominal wall hernia or abnormality. No abdominopelvic ascites. Musculoskeletal: Status post surgical posterior fusion extending from L3-S1.  No acute osseous abnormality seen in the abdomen or pelvis. Review of the MIP images confirms the above findings. IMPRESSION: No evidence of thoracic or abdominal aortic aneurysm or dissection. Aortic atherosclerosis. Small solitary gallstone is noted without cholecystitis. No other significant abnormality seen in the chest, abdomen or pelvis. Electronically Signed   By: Marijo Conception, M.D.   On: 07/24/2017 13:10    Procedures Procedures (including critical care time)  Medications Ordered in ED Medications  HYDROmorphone (DILAUDID) injection 1 mg (1 mg Intravenous Given 07/24/17 1143)  iopamidol (ISOVUE-370) 76 % injection 100 mL (100 mLs Intravenous Contrast Given 07/24/17 1230)  HYDROcodone-acetaminophen (NORCO/VICODIN) 5-325 MG per tablet 2 tablet (2 tablets Oral Given 07/24/17 1443)     Initial Impression / Assessment and Plan / ED Course  I have reviewed the triage vital signs and the nursing notes.  Pertinent labs & imaging  results that were available during my care of the patient were reviewed by me and considered in my medical decision making (see chart for details).     66 y.o. M with PMH/o DM, HTN who presents for evaluation of bilateral flank pain that began yesterday.  Patient reports that it was constant and became progressively worse last night.  He reports some associated nausea slight but no urinary complaints, fever.  Does have a history of spinal fusion surgery.  No fevers or red flag symptoms.  No neuro deficits noted on exam. Patient is afebrile, there is slightly uncomfortable.  On initial evaluation, patient was diaphoretic around his face.  He had difficulty moving from the wheelchair to the bed secondary to his pain.  Vital signs reviewed.  He is slightly hypertensive but otherwise unremarkable.  On physical exam, patient does have reproducible bilateral CVA tenderness.  Good pulses in bilateral upper and lower extremities.  Consider muscle strain versus UTI versus  kidney stone, though story is not typical presentation.  Also consider aortic dissection given history/physical exam.  History/physical exam not concerning for cauda equina or spinal abscess.  Plan to check basic labs and UA. Analgesics provided in the department.  Labs reviewed.  BMP within normal limits.  UA negative for any hemoglobin or signs of infectious etiology.  CBC unremarkable.  Given unremarkable UA, will plan to obtain CT a dissection study for further evaluation. Discussed patient with Dr. Ralene Bathe who independently evaluated the patient.   CTA chest and abdomen reviewed.  Negative for any acute dissection.  Discussed results with patient.  He reports improvement in pain.  Will attempt to ambulate patient in the department.  Patient was able to ambulate in the department.  He reports less pain with ambulation and initial ED arrival.  He reports the pain is similar started to return slightly but otherwise feels better.  Will give additional analgesics.  Patient feels better after analgesics.  Suspect symptoms are likely secondary to muscle strain.  He was able to ambulate in the department.  Vital signs stable.  Plan for discharge with short course of pain medications.  Patient encouraged to follow-up with primary care doctor in the next 24-48 hours for further evaluation. Patient had ample opportunity for questions and discussion. All patient's questions were answered with full understanding. Strict return precautions discussed. Patient expresses understanding and agreement to plan.   Final Clinical Impressions(s) / ED Diagnoses   Final diagnoses:  Musculoskeletal strain    ED Discharge Orders        Ordered    HYDROcodone-acetaminophen (NORCO/VICODIN) 5-325 MG tablet  Every 6 hours PRN     07/24/17 1507       Desma Mcgregor 07/24/17 1738    Quintella Reichert, MD 07/25/17 [phone]

## 2017-07-24 NOTE — Discharge Instructions (Signed)
You can take tylenol 1000 mg three times a day.  Not exceed 4000 mg of Tylenol per day.  He can also take pain medication for severe breakthrough pain.    Apply heat to the affected area.  Follow-up with your primary care doctor in the next 24-48 hours for further evaluation.  As we discussed, closely monitor symptoms.  Return to the emergency department for any worsening pain, numbness/weakness of the arms or legs, chest pain, difficulty breathing, fevers or any other worsening or concerning symptoms.

## 2017-07-24 NOTE — ED Notes (Signed)
Pt transported to CT ?

## 2017-07-24 NOTE — ED Triage Notes (Signed)
Bilateral flank pain since last night. Some nausea. No vomiting.

## 2017-09-02 DIAGNOSIS — M7918 Myalgia, other site: Secondary | ICD-10-CM | POA: Diagnosis not present

## 2017-09-02 DIAGNOSIS — M4326 Fusion of spine, lumbar region: Secondary | ICD-10-CM | POA: Diagnosis not present

## 2017-09-02 DIAGNOSIS — M545 Low back pain: Secondary | ICD-10-CM | POA: Diagnosis not present

## 2017-09-02 DIAGNOSIS — M542 Cervicalgia: Secondary | ICD-10-CM | POA: Diagnosis not present

## 2017-09-02 DIAGNOSIS — M4716 Other spondylosis with myelopathy, lumbar region: Secondary | ICD-10-CM | POA: Diagnosis not present

## 2017-09-28 DIAGNOSIS — H40011 Open angle with borderline findings, low risk, right eye: Secondary | ICD-10-CM | POA: Diagnosis not present

## 2017-09-28 DIAGNOSIS — H40052 Ocular hypertension, left eye: Secondary | ICD-10-CM | POA: Diagnosis not present

## 2017-09-28 DIAGNOSIS — H401121 Primary open-angle glaucoma, left eye, mild stage: Secondary | ICD-10-CM | POA: Diagnosis not present

## 2017-10-27 DIAGNOSIS — M47896 Other spondylosis, lumbar region: Secondary | ICD-10-CM | POA: Diagnosis not present

## 2017-10-27 DIAGNOSIS — M545 Low back pain: Secondary | ICD-10-CM | POA: Diagnosis not present

## 2017-10-27 DIAGNOSIS — M791 Myalgia, unspecified site: Secondary | ICD-10-CM | POA: Diagnosis not present

## 2017-11-02 DIAGNOSIS — G4733 Obstructive sleep apnea (adult) (pediatric): Secondary | ICD-10-CM | POA: Diagnosis not present

## 2017-11-02 DIAGNOSIS — F411 Generalized anxiety disorder: Secondary | ICD-10-CM | POA: Diagnosis not present

## 2017-11-02 DIAGNOSIS — I77811 Abdominal aortic ectasia: Secondary | ICD-10-CM | POA: Diagnosis not present

## 2017-11-02 DIAGNOSIS — Z0000 Encounter for general adult medical examination without abnormal findings: Secondary | ICD-10-CM | POA: Diagnosis not present

## 2017-11-02 DIAGNOSIS — Z7984 Long term (current) use of oral hypoglycemic drugs: Secondary | ICD-10-CM | POA: Diagnosis not present

## 2017-11-02 DIAGNOSIS — E785 Hyperlipidemia, unspecified: Secondary | ICD-10-CM | POA: Diagnosis not present

## 2017-11-02 DIAGNOSIS — M109 Gout, unspecified: Secondary | ICD-10-CM | POA: Diagnosis not present

## 2017-11-02 DIAGNOSIS — I10 Essential (primary) hypertension: Secondary | ICD-10-CM | POA: Diagnosis not present

## 2017-11-02 DIAGNOSIS — E039 Hypothyroidism, unspecified: Secondary | ICD-10-CM | POA: Diagnosis not present

## 2017-11-02 DIAGNOSIS — E1140 Type 2 diabetes mellitus with diabetic neuropathy, unspecified: Secondary | ICD-10-CM | POA: Diagnosis not present

## 2017-11-02 DIAGNOSIS — G4700 Insomnia, unspecified: Secondary | ICD-10-CM | POA: Diagnosis not present

## 2017-12-08 DIAGNOSIS — E1140 Type 2 diabetes mellitus with diabetic neuropathy, unspecified: Secondary | ICD-10-CM | POA: Diagnosis not present

## 2017-12-08 DIAGNOSIS — E039 Hypothyroidism, unspecified: Secondary | ICD-10-CM | POA: Diagnosis not present

## 2017-12-08 DIAGNOSIS — E118 Type 2 diabetes mellitus with unspecified complications: Secondary | ICD-10-CM | POA: Diagnosis not present

## 2017-12-08 DIAGNOSIS — E890 Postprocedural hypothyroidism: Secondary | ICD-10-CM | POA: Diagnosis not present

## 2017-12-08 DIAGNOSIS — E782 Mixed hyperlipidemia: Secondary | ICD-10-CM | POA: Diagnosis not present

## 2017-12-08 DIAGNOSIS — E041 Nontoxic single thyroid nodule: Secondary | ICD-10-CM | POA: Diagnosis not present

## 2017-12-22 DIAGNOSIS — S91111A Laceration without foreign body of right great toe without damage to nail, initial encounter: Secondary | ICD-10-CM | POA: Diagnosis not present

## 2018-02-01 DIAGNOSIS — E119 Type 2 diabetes mellitus without complications: Secondary | ICD-10-CM | POA: Diagnosis not present

## 2018-02-01 DIAGNOSIS — H401121 Primary open-angle glaucoma, left eye, mild stage: Secondary | ICD-10-CM | POA: Diagnosis not present

## 2018-02-01 DIAGNOSIS — H40011 Open angle with borderline findings, low risk, right eye: Secondary | ICD-10-CM | POA: Diagnosis not present

## 2018-02-01 DIAGNOSIS — H353131 Nonexudative age-related macular degeneration, bilateral, early dry stage: Secondary | ICD-10-CM | POA: Diagnosis not present

## 2018-03-10 DIAGNOSIS — E890 Postprocedural hypothyroidism: Secondary | ICD-10-CM | POA: Diagnosis not present

## 2018-04-29 ENCOUNTER — Other Ambulatory Visit: Payer: Self-pay | Admitting: Internal Medicine

## 2018-04-29 DIAGNOSIS — E041 Nontoxic single thyroid nodule: Secondary | ICD-10-CM

## 2018-05-11 ENCOUNTER — Ambulatory Visit
Admission: RE | Admit: 2018-05-11 | Discharge: 2018-05-11 | Disposition: A | Discharge status: Home / Self Care | Payer: Medicare Other | Source: Ambulatory Visit | Attending: Internal Medicine | Admitting: Internal Medicine

## 2018-05-11 DIAGNOSIS — Z85850 Personal history of malignant neoplasm of thyroid: Secondary | ICD-10-CM | POA: Diagnosis not present

## 2018-05-11 DIAGNOSIS — E041 Nontoxic single thyroid nodule: Secondary | ICD-10-CM

## 2018-05-13 DIAGNOSIS — M109 Gout, unspecified: Secondary | ICD-10-CM | POA: Diagnosis not present

## 2018-05-13 DIAGNOSIS — K219 Gastro-esophageal reflux disease without esophagitis: Secondary | ICD-10-CM | POA: Diagnosis not present

## 2018-05-13 DIAGNOSIS — F411 Generalized anxiety disorder: Secondary | ICD-10-CM | POA: Diagnosis not present

## 2018-05-13 DIAGNOSIS — E785 Hyperlipidemia, unspecified: Secondary | ICD-10-CM | POA: Diagnosis not present

## 2018-05-13 DIAGNOSIS — G4733 Obstructive sleep apnea (adult) (pediatric): Secondary | ICD-10-CM | POA: Diagnosis not present

## 2018-05-13 DIAGNOSIS — Z79899 Other long term (current) drug therapy: Secondary | ICD-10-CM | POA: Diagnosis not present

## 2018-05-13 DIAGNOSIS — G4700 Insomnia, unspecified: Secondary | ICD-10-CM | POA: Diagnosis not present

## 2018-05-13 DIAGNOSIS — Z7984 Long term (current) use of oral hypoglycemic drugs: Secondary | ICD-10-CM | POA: Diagnosis not present

## 2018-05-13 DIAGNOSIS — I10 Essential (primary) hypertension: Secondary | ICD-10-CM | POA: Diagnosis not present

## 2018-05-13 DIAGNOSIS — E1140 Type 2 diabetes mellitus with diabetic neuropathy, unspecified: Secondary | ICD-10-CM | POA: Diagnosis not present

## 2018-05-13 DIAGNOSIS — E039 Hypothyroidism, unspecified: Secondary | ICD-10-CM | POA: Diagnosis not present

## 2018-05-31 DIAGNOSIS — E041 Nontoxic single thyroid nodule: Secondary | ICD-10-CM | POA: Diagnosis not present

## 2018-05-31 DIAGNOSIS — E118 Type 2 diabetes mellitus with unspecified complications: Secondary | ICD-10-CM | POA: Diagnosis not present

## 2018-06-04 IMAGING — CT CT ANGIO CHEST-ABD-PELV FOR DISSECTION W/ AND WO/W CM
2 of 9 series · 14 of 46 positions shown, 16 images · IV contrast (iopamidol)
Comparison: None.

CLINICAL DATA: Acute bilateral flank pain.

EXAM:
CT ANGIOGRAPHY CHEST, ABDOMEN AND PELVIS
TECHNIQUE: Multidetector CT imaging through the chest, abdomen and pelvis was
performed using the standard protocol during bolus administration of
intravenous contrast. Multiplanar reconstructed images and MIPs were
obtained and reviewed to evaluate the vascular anatomy.
CONTRAST:  100mL DZWT4Y-BTQ IOPAMIDOL (DZWT4Y-BTQ) INJECTION 76%

[Series 5: axial arterial · axial · arterial · 0.97mm/px · z∈[+323,+917]mm · 11 of 222 slices shown, 13 images]
[im 12/222  soft-tissue]
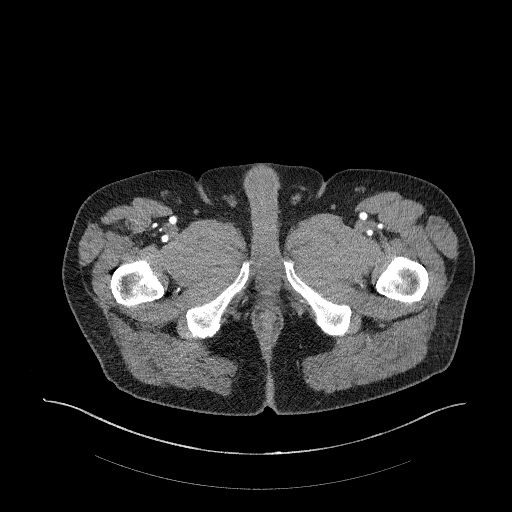
[im 12/222  bone]
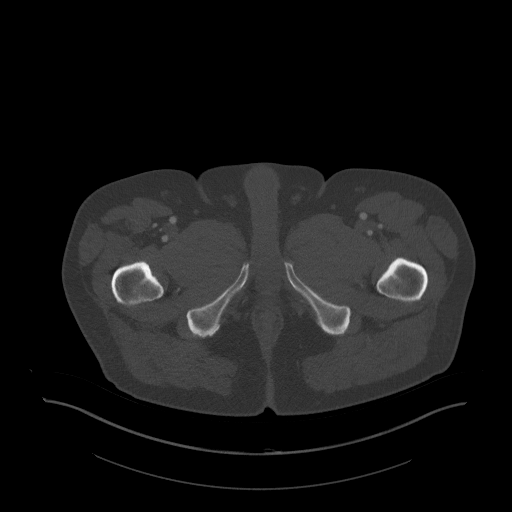
[im 35/222  soft-tissue]
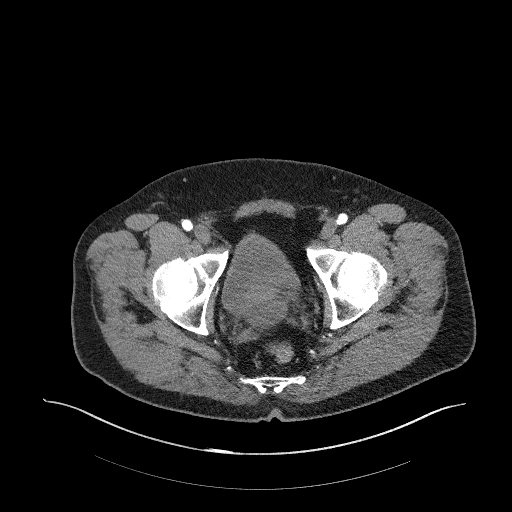
[im 59/222  soft-tissue]
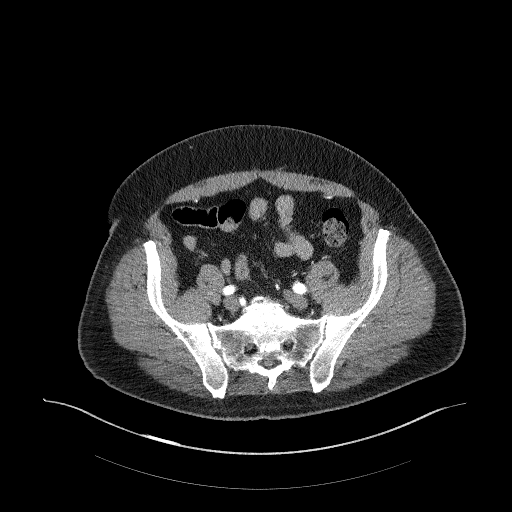
[im 70/222  soft-tissue]
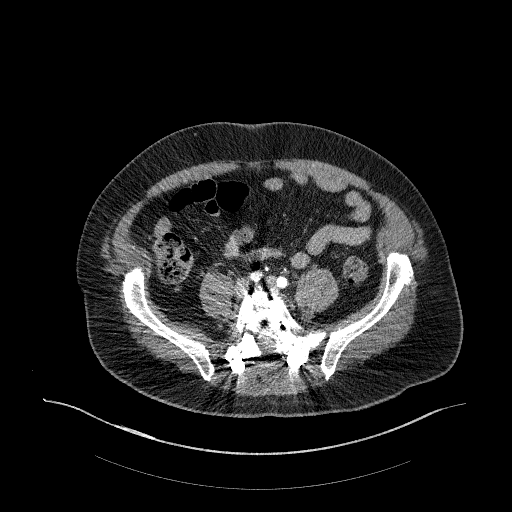
[im 94/222  soft-tissue]
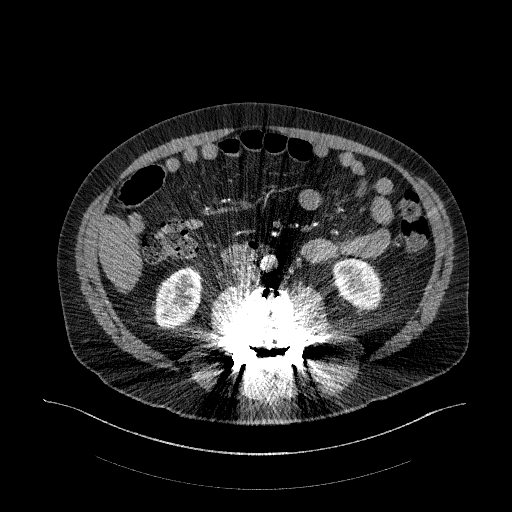
[im 117/222  soft-tissue]
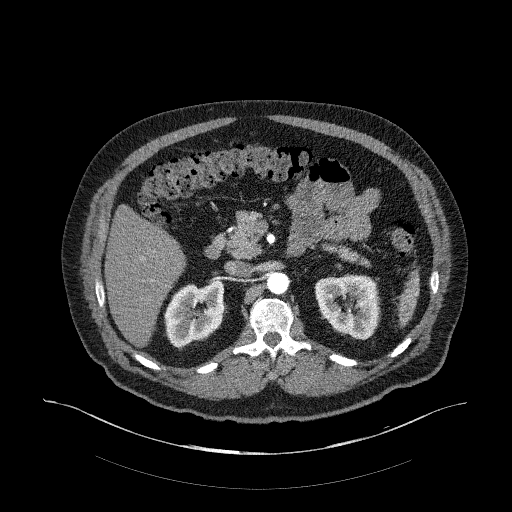
[im 128/222  soft-tissue]
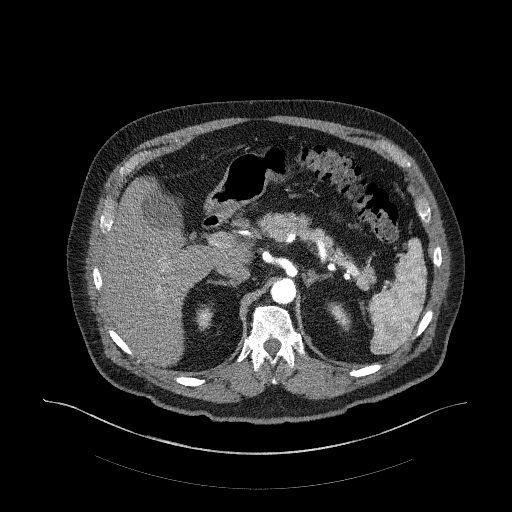
[im 152/222  soft-tissue]
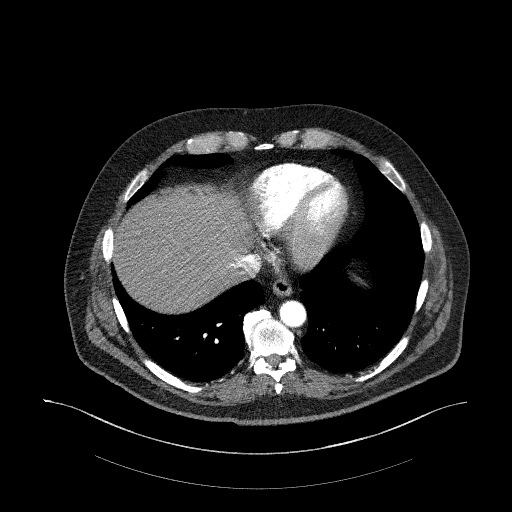
[im 163/222  soft-tissue]
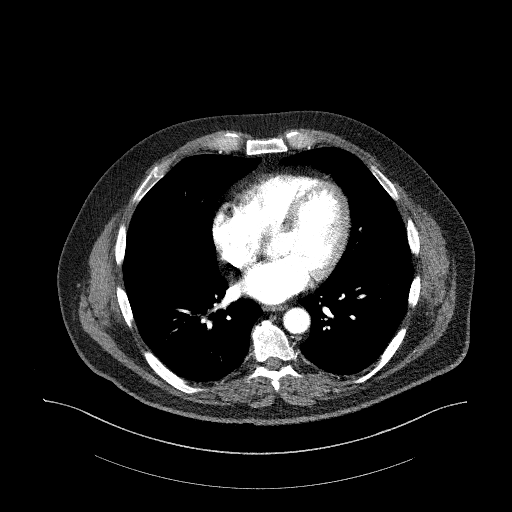
[im 163/222  bone]
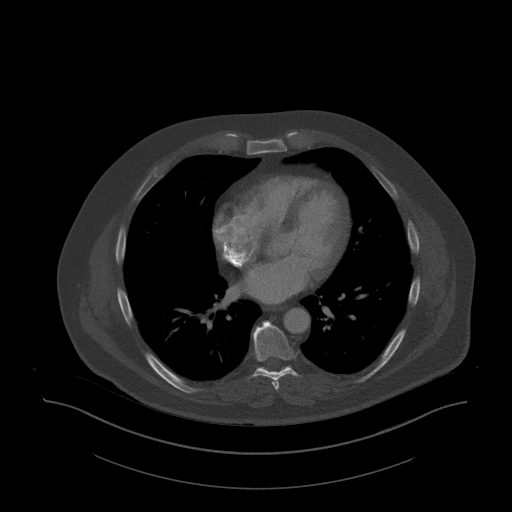
[im 187/222  soft-tissue]
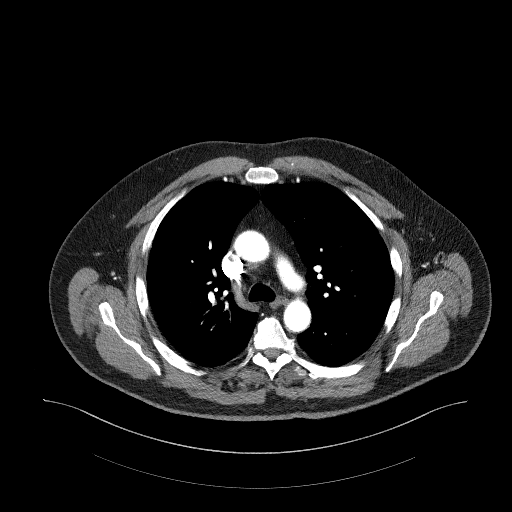
[im 210/222  soft-tissue]
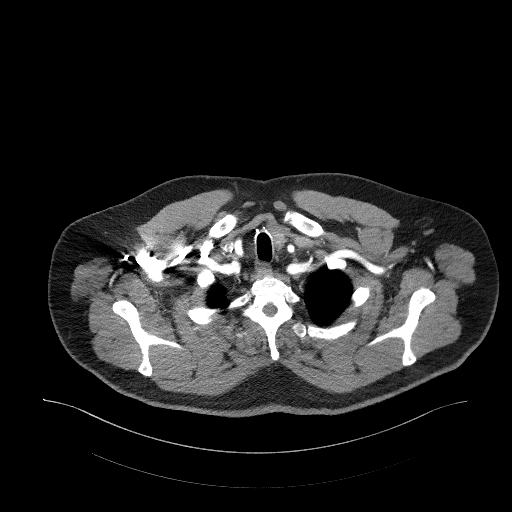

[Series 8: coronals · coronal · 0.88mm/px · 3 of 150 slices shown]
[im 38/150  soft-tissue]
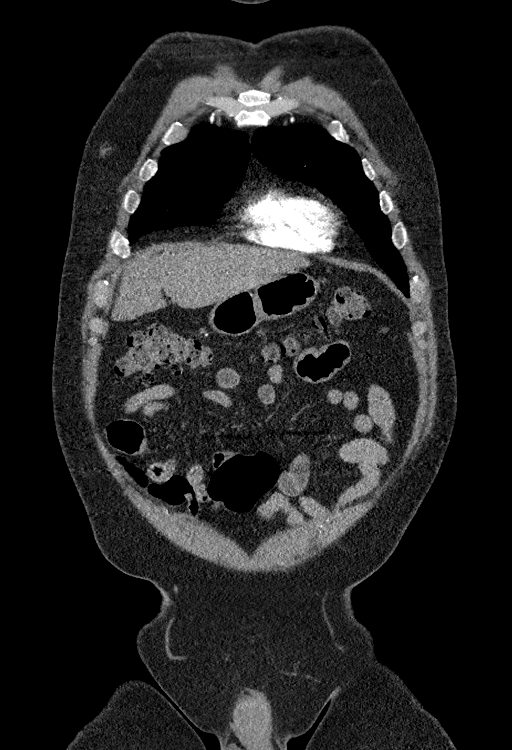
[im 75/150  soft-tissue]
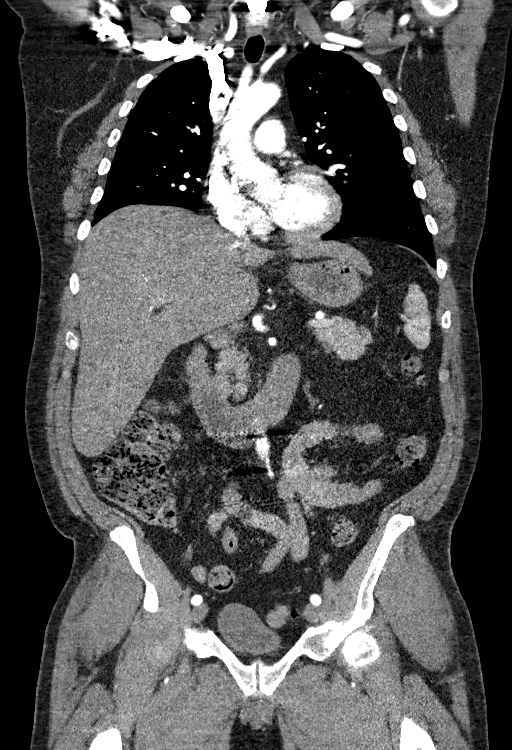
[im 112/150  soft-tissue]
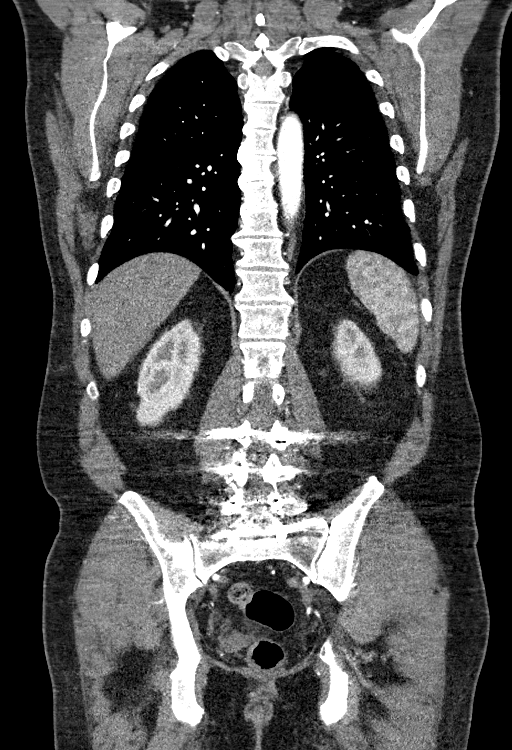

[14 of 46 positions shown; findings below may reference images not displayed]

FINDINGS: CTA CHEST FINDINGS

Cardiovascular: Atherosclerosis of thoracic aorta is noted without
aneurysm or dissection. Normal cardiac size. No pericardial
effusion.

Mediastinum/Nodes: No enlarged mediastinal, hilar, or axillary lymph
nodes. Trachea and esophagus demonstrate no significant findings.
Status post right thyroidectomy.

Lungs/Pleura: Lungs are clear. No pleural effusion or pneumothorax.

Musculoskeletal: No chest wall abnormality. No acute or significant
osseous findings.

Review of the MIP images confirms the above findings.

CTA ABDOMEN AND PELVIS FINDINGS

VASCULAR

Aorta: Normal caliber aorta without aneurysm, dissection, vasculitis
or significant stenosis.

Celiac: Patent without evidence of aneurysm, dissection, vasculitis
or significant stenosis.

SMA: Patent without evidence of aneurysm, dissection, vasculitis or
significant stenosis.

Renals: Both renal arteries are patent without evidence of aneurysm,
dissection, vasculitis, fibromuscular dysplasia or significant
stenosis.

IMA: Patent without evidence of aneurysm, dissection, vasculitis or
significant stenosis.

Inflow: Patent without evidence of aneurysm, dissection, vasculitis
or significant stenosis.

Veins: No obvious venous abnormality within the limitations of this
arterial phase study.

Review of the MIP images confirms the above findings.

NON-VASCULAR

Hepatobiliary: Small solitary gallstone is noted without
inflammation. The liver is unremarkable.

Pancreas: Unremarkable. No pancreatic ductal dilatation or
surrounding inflammatory changes.

Spleen: Normal in size without focal abnormality.

Adrenals/Urinary Tract: Adrenal glands are unremarkable. Kidneys are
normal, without renal calculi, focal lesion, or hydronephrosis.
Bladder is unremarkable.

Stomach/Bowel: Stomach is within normal limits. Appendix appears
normal. No evidence of bowel wall thickening, distention, or
inflammatory changes.

Lymphatic: No significant vascular findings are present. No enlarged
abdominal or pelvic lymph nodes.

Reproductive: Prostate is unremarkable.

Other: No abdominal wall hernia or abnormality. No abdominopelvic
ascites.

Musculoskeletal: Status post surgical posterior fusion extending
from L3-S1. No acute osseous abnormality seen in the abdomen or
pelvis.

Review of the MIP images confirms the above findings.
IMPRESSION: No evidence of thoracic or abdominal aortic aneurysm or dissection.

Aortic atherosclerosis.

Small solitary gallstone is noted without cholecystitis.

No other significant abnormality seen in the chest, abdomen or
pelvis.

## 2018-06-09 DIAGNOSIS — E041 Nontoxic single thyroid nodule: Secondary | ICD-10-CM | POA: Diagnosis not present

## 2018-06-09 DIAGNOSIS — E890 Postprocedural hypothyroidism: Secondary | ICD-10-CM | POA: Diagnosis not present

## 2018-06-09 DIAGNOSIS — E1140 Type 2 diabetes mellitus with diabetic neuropathy, unspecified: Secondary | ICD-10-CM | POA: Diagnosis not present

## 2018-06-09 DIAGNOSIS — E782 Mixed hyperlipidemia: Secondary | ICD-10-CM | POA: Diagnosis not present

## 2018-07-29 DIAGNOSIS — G4733 Obstructive sleep apnea (adult) (pediatric): Secondary | ICD-10-CM | POA: Diagnosis not present

## 2018-08-13 DIAGNOSIS — H401121 Primary open-angle glaucoma, left eye, mild stage: Secondary | ICD-10-CM | POA: Diagnosis not present

## 2018-08-13 DIAGNOSIS — H40011 Open angle with borderline findings, low risk, right eye: Secondary | ICD-10-CM | POA: Diagnosis not present

## 2018-10-12 DIAGNOSIS — M7918 Myalgia, other site: Secondary | ICD-10-CM | POA: Diagnosis not present

## 2018-10-12 DIAGNOSIS — M4326 Fusion of spine, lumbar region: Secondary | ICD-10-CM | POA: Diagnosis not present

## 2018-10-12 DIAGNOSIS — M545 Low back pain: Secondary | ICD-10-CM | POA: Diagnosis not present

## 2018-11-15 DIAGNOSIS — E538 Deficiency of other specified B group vitamins: Secondary | ICD-10-CM | POA: Diagnosis not present

## 2018-11-15 DIAGNOSIS — E785 Hyperlipidemia, unspecified: Secondary | ICD-10-CM | POA: Diagnosis not present

## 2018-11-15 DIAGNOSIS — M109 Gout, unspecified: Secondary | ICD-10-CM | POA: Diagnosis not present

## 2018-11-15 DIAGNOSIS — I10 Essential (primary) hypertension: Secondary | ICD-10-CM | POA: Diagnosis not present

## 2018-11-15 DIAGNOSIS — K219 Gastro-esophageal reflux disease without esophagitis: Secondary | ICD-10-CM | POA: Diagnosis not present

## 2018-11-15 DIAGNOSIS — G4700 Insomnia, unspecified: Secondary | ICD-10-CM | POA: Diagnosis not present

## 2018-11-15 DIAGNOSIS — E039 Hypothyroidism, unspecified: Secondary | ICD-10-CM | POA: Diagnosis not present

## 2018-11-15 DIAGNOSIS — Z0000 Encounter for general adult medical examination without abnormal findings: Secondary | ICD-10-CM | POA: Diagnosis not present

## 2018-11-15 DIAGNOSIS — G4733 Obstructive sleep apnea (adult) (pediatric): Secondary | ICD-10-CM | POA: Diagnosis not present

## 2018-11-15 DIAGNOSIS — E1140 Type 2 diabetes mellitus with diabetic neuropathy, unspecified: Secondary | ICD-10-CM | POA: Diagnosis not present

## 2018-11-15 DIAGNOSIS — F411 Generalized anxiety disorder: Secondary | ICD-10-CM | POA: Diagnosis not present

## 2018-11-25 DIAGNOSIS — M4326 Fusion of spine, lumbar region: Secondary | ICD-10-CM | POA: Diagnosis not present

## 2018-11-25 DIAGNOSIS — M7918 Myalgia, other site: Secondary | ICD-10-CM | POA: Diagnosis not present

## 2018-12-07 DIAGNOSIS — E890 Postprocedural hypothyroidism: Secondary | ICD-10-CM | POA: Diagnosis not present

## 2018-12-07 DIAGNOSIS — E782 Mixed hyperlipidemia: Secondary | ICD-10-CM | POA: Diagnosis not present

## 2018-12-07 DIAGNOSIS — E1140 Type 2 diabetes mellitus with diabetic neuropathy, unspecified: Secondary | ICD-10-CM | POA: Diagnosis not present

## 2018-12-14 DIAGNOSIS — E782 Mixed hyperlipidemia: Secondary | ICD-10-CM | POA: Diagnosis not present

## 2018-12-14 DIAGNOSIS — E1140 Type 2 diabetes mellitus with diabetic neuropathy, unspecified: Secondary | ICD-10-CM | POA: Diagnosis not present

## 2018-12-14 DIAGNOSIS — Z6835 Body mass index (BMI) 35.0-35.9, adult: Secondary | ICD-10-CM | POA: Diagnosis not present

## 2018-12-14 DIAGNOSIS — E890 Postprocedural hypothyroidism: Secondary | ICD-10-CM | POA: Diagnosis not present

## 2018-12-14 DIAGNOSIS — Z7189 Other specified counseling: Secondary | ICD-10-CM | POA: Diagnosis not present

## 2018-12-14 DIAGNOSIS — E041 Nontoxic single thyroid nodule: Secondary | ICD-10-CM | POA: Diagnosis not present

## 2019-02-03 DIAGNOSIS — E119 Type 2 diabetes mellitus without complications: Secondary | ICD-10-CM | POA: Diagnosis not present

## 2019-02-03 DIAGNOSIS — H353131 Nonexudative age-related macular degeneration, bilateral, early dry stage: Secondary | ICD-10-CM | POA: Diagnosis not present

## 2019-02-03 DIAGNOSIS — H401121 Primary open-angle glaucoma, left eye, mild stage: Secondary | ICD-10-CM | POA: Diagnosis not present

## 2019-02-03 DIAGNOSIS — H40011 Open angle with borderline findings, low risk, right eye: Secondary | ICD-10-CM | POA: Diagnosis not present

## 2019-05-02 DIAGNOSIS — M545 Low back pain: Secondary | ICD-10-CM | POA: Diagnosis not present

## 2019-05-02 DIAGNOSIS — M7918 Myalgia, other site: Secondary | ICD-10-CM | POA: Diagnosis not present

## 2019-05-02 DIAGNOSIS — M4326 Fusion of spine, lumbar region: Secondary | ICD-10-CM | POA: Diagnosis not present

## 2019-05-05 DIAGNOSIS — Z23 Encounter for immunization: Secondary | ICD-10-CM | POA: Diagnosis not present

## 2019-05-16 DIAGNOSIS — K439 Ventral hernia without obstruction or gangrene: Secondary | ICD-10-CM | POA: Diagnosis not present

## 2019-05-16 DIAGNOSIS — K219 Gastro-esophageal reflux disease without esophagitis: Secondary | ICD-10-CM | POA: Diagnosis not present

## 2019-05-16 DIAGNOSIS — I10 Essential (primary) hypertension: Secondary | ICD-10-CM | POA: Diagnosis not present

## 2019-05-16 DIAGNOSIS — E538 Deficiency of other specified B group vitamins: Secondary | ICD-10-CM | POA: Diagnosis not present

## 2019-05-16 DIAGNOSIS — G4700 Insomnia, unspecified: Secondary | ICD-10-CM | POA: Diagnosis not present

## 2019-05-16 DIAGNOSIS — E785 Hyperlipidemia, unspecified: Secondary | ICD-10-CM | POA: Diagnosis not present

## 2019-05-16 DIAGNOSIS — E039 Hypothyroidism, unspecified: Secondary | ICD-10-CM | POA: Diagnosis not present

## 2019-05-16 DIAGNOSIS — E1140 Type 2 diabetes mellitus with diabetic neuropathy, unspecified: Secondary | ICD-10-CM | POA: Diagnosis not present

## 2019-05-16 DIAGNOSIS — G4733 Obstructive sleep apnea (adult) (pediatric): Secondary | ICD-10-CM | POA: Diagnosis not present

## 2019-05-16 DIAGNOSIS — F411 Generalized anxiety disorder: Secondary | ICD-10-CM | POA: Diagnosis not present

## 2019-05-16 DIAGNOSIS — M545 Low back pain: Secondary | ICD-10-CM | POA: Diagnosis not present

## 2019-05-18 DIAGNOSIS — Z20828 Contact with and (suspected) exposure to other viral communicable diseases: Secondary | ICD-10-CM | POA: Diagnosis not present

## 2019-06-03 DIAGNOSIS — M545 Low back pain: Secondary | ICD-10-CM | POA: Diagnosis not present

## 2019-06-03 DIAGNOSIS — M4326 Fusion of spine, lumbar region: Secondary | ICD-10-CM | POA: Diagnosis not present

## 2019-06-03 DIAGNOSIS — M47816 Spondylosis without myelopathy or radiculopathy, lumbar region: Secondary | ICD-10-CM | POA: Diagnosis not present

## 2019-06-03 DIAGNOSIS — M7918 Myalgia, other site: Secondary | ICD-10-CM | POA: Diagnosis not present

## 2019-06-13 DIAGNOSIS — E1140 Type 2 diabetes mellitus with diabetic neuropathy, unspecified: Secondary | ICD-10-CM | POA: Diagnosis not present

## 2019-06-13 DIAGNOSIS — E782 Mixed hyperlipidemia: Secondary | ICD-10-CM | POA: Diagnosis not present

## 2019-06-13 DIAGNOSIS — E890 Postprocedural hypothyroidism: Secondary | ICD-10-CM | POA: Diagnosis not present

## 2019-06-15 DIAGNOSIS — M47816 Spondylosis without myelopathy or radiculopathy, lumbar region: Secondary | ICD-10-CM | POA: Diagnosis not present

## 2019-06-16 DIAGNOSIS — Z7189 Other specified counseling: Secondary | ICD-10-CM | POA: Diagnosis not present

## 2019-06-16 DIAGNOSIS — E1140 Type 2 diabetes mellitus with diabetic neuropathy, unspecified: Secondary | ICD-10-CM | POA: Diagnosis not present

## 2019-06-16 DIAGNOSIS — E876 Hypokalemia: Secondary | ICD-10-CM | POA: Diagnosis not present

## 2019-06-16 DIAGNOSIS — E041 Nontoxic single thyroid nodule: Secondary | ICD-10-CM | POA: Diagnosis not present

## 2019-06-16 DIAGNOSIS — E782 Mixed hyperlipidemia: Secondary | ICD-10-CM | POA: Diagnosis not present

## 2019-06-16 DIAGNOSIS — E890 Postprocedural hypothyroidism: Secondary | ICD-10-CM | POA: Diagnosis not present

## 2019-06-16 DIAGNOSIS — Z6835 Body mass index (BMI) 35.0-35.9, adult: Secondary | ICD-10-CM | POA: Diagnosis not present

## 2019-06-27 DIAGNOSIS — E876 Hypokalemia: Secondary | ICD-10-CM | POA: Diagnosis not present

## 2019-06-28 DIAGNOSIS — M47816 Spondylosis without myelopathy or radiculopathy, lumbar region: Secondary | ICD-10-CM | POA: Diagnosis not present

## 2019-07-11 DIAGNOSIS — M47816 Spondylosis without myelopathy or radiculopathy, lumbar region: Secondary | ICD-10-CM | POA: Diagnosis not present

## 2019-08-02 ENCOUNTER — Other Ambulatory Visit: Payer: Self-pay

## 2019-08-02 ENCOUNTER — Emergency Department (HOSPITAL_BASED_OUTPATIENT_CLINIC_OR_DEPARTMENT_OTHER)
Admission: EM | Admit: 2019-08-02 | Discharge: 2019-08-02 | Disposition: A | Discharge status: Home / Self Care | Payer: Medicare Other | Attending: Emergency Medicine | Admitting: Emergency Medicine

## 2019-08-02 ENCOUNTER — Encounter (HOSPITAL_BASED_OUTPATIENT_CLINIC_OR_DEPARTMENT_OTHER): Payer: Self-pay | Admitting: *Deleted

## 2019-08-02 ENCOUNTER — Emergency Department (HOSPITAL_BASED_OUTPATIENT_CLINIC_OR_DEPARTMENT_OTHER): Payer: Medicare Other

## 2019-08-02 DIAGNOSIS — Z7982 Long term (current) use of aspirin: Secondary | ICD-10-CM | POA: Diagnosis not present

## 2019-08-02 DIAGNOSIS — Z79899 Other long term (current) drug therapy: Secondary | ICD-10-CM | POA: Insufficient documentation

## 2019-08-02 DIAGNOSIS — E119 Type 2 diabetes mellitus without complications: Secondary | ICD-10-CM | POA: Diagnosis not present

## 2019-08-02 DIAGNOSIS — R519 Headache, unspecified: Secondary | ICD-10-CM | POA: Insufficient documentation

## 2019-08-02 DIAGNOSIS — J189 Pneumonia, unspecified organism: Secondary | ICD-10-CM | POA: Diagnosis not present

## 2019-08-02 DIAGNOSIS — R5383 Other fatigue: Secondary | ICD-10-CM | POA: Insufficient documentation

## 2019-08-02 DIAGNOSIS — R0902 Hypoxemia: Secondary | ICD-10-CM | POA: Diagnosis not present

## 2019-08-02 DIAGNOSIS — Z7984 Long term (current) use of oral hypoglycemic drugs: Secondary | ICD-10-CM | POA: Diagnosis not present

## 2019-08-02 DIAGNOSIS — I10 Essential (primary) hypertension: Secondary | ICD-10-CM | POA: Insufficient documentation

## 2019-08-02 DIAGNOSIS — Z20828 Contact with and (suspected) exposure to other viral communicable diseases: Secondary | ICD-10-CM | POA: Insufficient documentation

## 2019-08-02 DIAGNOSIS — R05 Cough: Secondary | ICD-10-CM | POA: Diagnosis not present

## 2019-08-02 DIAGNOSIS — Z03818 Encounter for observation for suspected exposure to other biological agents ruled out: Secondary | ICD-10-CM | POA: Diagnosis not present

## 2019-08-02 DIAGNOSIS — Z20822 Contact with and (suspected) exposure to covid-19: Secondary | ICD-10-CM

## 2019-08-02 LAB — BASIC METABOLIC PANEL
Anion gap: 16 — ABNORMAL HIGH (ref 5–15)
BUN: 13 mg/dL (ref 8–23)
CO2: 22 mmol/L (ref 22–32)
Calcium: 9 mg/dL (ref 8.9–10.3)
Chloride: 97 mmol/L — ABNORMAL LOW (ref 98–111)
Creatinine, Ser: 0.75 mg/dL (ref 0.61–1.24)
GFR calc Af Amer: >60 mL/min (ref >60)
GFR calc non Af Amer: >60 mL/min (ref >60)
Glucose, Bld: 134 mg/dL — ABNORMAL HIGH (ref 70–99)
Potassium: 3 mmol/L — ABNORMAL LOW (ref 3.5–5.1)
Sodium: 135 mmol/L (ref 135–145)

## 2019-08-02 LAB — URINALYSIS, ROUTINE W REFLEX MICROSCOPIC
Bilirubin Urine: NEGATIVE
Glucose, UA: NEGATIVE mg/dL
Hgb urine dipstick: NEGATIVE
Ketones, ur: 15 mg/dL — AB
Leukocytes,Ua: NEGATIVE
Nitrite: NEGATIVE
Protein, ur: NEGATIVE mg/dL
Specific Gravity, Urine: 1.015 (ref 1.005–1.030)
pH: 7.5 (ref 5.0–8.0)

## 2019-08-02 LAB — CBC WITH DIFFERENTIAL/PLATELET
Abs Immature Granulocytes: 0.11 10*3/uL — ABNORMAL HIGH (ref 0.00–0.07)
Basophils Absolute: 0 10*3/uL (ref 0.0–0.1)
Basophils Relative: 0 %
Eosinophils Absolute: 0.1 10*3/uL (ref 0.0–0.5)
Eosinophils Relative: 1 %
HCT: 41.7 % (ref 39.0–52.0)
Hemoglobin: 13.9 g/dL (ref 13.0–17.0)
Immature Granulocytes: 1 %
Lymphocytes Relative: 10 %
Lymphs Abs: 0.9 10*3/uL (ref 0.7–4.0)
MCH: 29.8 pg (ref 26.0–34.0)
MCHC: 33.3 g/dL (ref 30.0–36.0)
MCV: 89.3 fL (ref 80.0–100.0)
Monocytes Absolute: 0.5 10*3/uL (ref 0.1–1.0)
Monocytes Relative: 6 %
Neutro Abs: 7 10*3/uL (ref 1.7–7.7)
Neutrophils Relative %: 82 %
Platelets: 414 10*3/uL — ABNORMAL HIGH (ref 150–400)
RBC: 4.67 MIL/uL (ref 4.22–5.81)
RDW: 12.8 % (ref 11.5–15.5)
WBC: 8.6 10*3/uL (ref 4.0–10.5)
nRBC: 0 % (ref 0.0–0.2)

## 2019-08-02 MED ORDER — DOXYCYCLINE HYCLATE 100 MG PO CAPS: 100.0000 mg | ORAL_CAPSULE | Freq: Two times a day (BID) | ORAL | 0 refills | Status: AC

## 2019-08-02 NOTE — ED Triage Notes (Signed)
Cough, sob, chills, body aches, headache, fatigue, sore throat, runny nose and diarrhea. He had a negative Covid test today. His wife and kids are positive for Covid.

## 2019-08-02 NOTE — Discharge Instructions (Addendum)
Please take antibiotic as prescribed.  Recommend calling her primary doctor to schedule virtual recheck in 24 to 48 hours.  Recommend following isolation precautions as discussed.  If you have any increase in your difficulty in breathing, any chest pain, other worsening symptoms, please return to ER for reassessment.  Recommend Tylenol, Motrin as needed for body aches and fevers.

## 2019-08-02 NOTE — ED Provider Notes (Signed)
[facility]ERGENCY DEPARTMENT Provider Note   CSN: [id number] Arrival date & time: 08/02/19  1547     History Chief Complaint  Patient presents with  . Covid Symptoms    Allen Mcdaniel is a 68 y.o. male.  Presents to ER with flulike symptoms.  Patient states for approximately 1 week he has noted dry cough, chills, body aches, dull headaches as well as fatigue.  Has noted nasal congestion.  Has had a couple loose stools, no blood in stool.  No abdominal pain nausea or vomiting.  States his wife and children are positive for Covid.  States he had a point-of-care test performed that was negative.  No chest pain.  No difficulty in breathing.  HPI     Past Medical History:  Diagnosis Date  . Allergy   . Anxiety   . Arthritis   . Cancer ([place])   . Complication of anesthesia    with back surgery-too much Morphine,O2 too low  . Diabetes mellitus without complication ([place])   . H/O hiatal hernia   . Hyperlipidemia   . Hypertension   . Sleep apnea    setting -18  . Thyroid disease     Patient Active Problem List   Diagnosis Date Noted  . Thyroiditis, lymphocytic 07/21/2013  . Papillary thyroid carcinoma, follicular variant, 68mm, right lobe 07/21/2013    Past Surgical History:  Procedure Laterality Date  . BACK SURGERY    . JOINT REPLACEMENT     left and right knee  . SHOULDER SURGERY    . THYROID LOBECTOMY Right 06/23/2013   Procedure: THYROID LOBECTOMY;  Surgeon: Earnstine Regal, MD;  Location: [facility];  Service: General;  Laterality: Right;  . TONSILLECTOMY     as child       No family history on file.  Social History   Tobacco Use  . Smoking status: Never Smoker  . Smokeless tobacco: Never Used  Substance Use Topics  . Alcohol use: Not Currently    Comment: occassionally  . Drug use: No    Home Medications Prior to Admission medications   Medication Sig Start Date End Date Taking? Authorizing Provider  aspirin 81 MG tablet Take 81 mg by  mouth daily.   Yes [provider]  CELEBREX 200 MG capsule Take 200 mg by mouth once a week.  03/30/13  Yes [provider]  diazepam (VALIUM) 5 MG tablet Take 5 mg by mouth every 6 (six) hours as needed for anxiety.   Yes [provider]  enalapril-hydrochlorothiazide (VASERETIC) 10-25 MG per tablet Take 1 tablet by mouth daily.   Yes [provider]  FLUoxetine (PROZAC) 40 MG capsule Take 40 mg by mouth daily.  04/21/13  Yes [provider]  HYDROcodone-acetaminophen (NORCO/VICODIN) 5-325 MG tablet Take 1-2 tablets by mouth every 6 (six) hours as needed. 07/24/17  Yes Volanda Napoleon, PA-C  levothyroxine (SYNTHROID, LEVOTHROID) 25 MCG tablet Take 25 mcg by mouth daily before breakfast.   Yes [provider]  loratadine (CLARITIN) 10 MG tablet Take 10 mg by mouth daily.   Yes [provider]  metFORMIN (GLUCOPHAGE) 500 MG tablet Take 500 mg by mouth 2 (two) times daily with a meal.   Yes [provider]  predniSONE (DELTASONE) 20 MG tablet Take 20 mg by mouth daily with breakfast. Take 3 tablets x 3 days, 2 tablets x 3 days, 1 tablet x 3 days once a day orally for 9 days 06/10/13  Yes [provider]  pregabalin (LYRICA) 50 MG capsule Take 50 mg by mouth 2 (two) times a week.   Yes [provider]  rosuvastatin (CRESTOR) 20 MG tablet Take 10 mg by mouth daily.    Yes [provider]  zolpidem (AMBIEN) 10 MG tablet Take 10 mg by mouth at bedtime as needed for sleep.   Yes [provider]  doxycycline (VIBRAMYCIN) 100 MG capsule Take 1 capsule (100 mg total) by mouth 2 (two) times daily for 7 days. 08/02/19 08/09/19  Lucrezia Starch, MD    Allergies    Morphine  Review of Systems   Review of Systems  Constitutional: Positive for chills and fatigue. Negative for fever.  HENT: Negative for ear pain and sore throat.   Eyes: Negative for pain and visual disturbance.  Respiratory: Positive  for cough. Negative for shortness of breath.   Cardiovascular: Negative for chest pain and palpitations.  Gastrointestinal: Negative for abdominal pain and vomiting.  Genitourinary: Negative for dysuria and hematuria.  Musculoskeletal: Positive for myalgias. Negative for arthralgias and back pain.  Skin: Negative for color change and rash.  Neurological: Negative for seizures and syncope.  All other systems reviewed and are negative.   Physical Exam Updated Vital Signs BP (!) 128/91 (BP Location: Right Arm)   Pulse 88   Temp 98 F (36.7 C) (Oral)   Resp 18   Ht 5\' 8"  (1.727 m)   Wt 94.3 kg   SpO2 98%   BMI 31.63 kg/m   Physical Exam Vitals and nursing note reviewed.  Constitutional:      Appearance: He is well-developed.  HENT:     Head: Normocephalic and atraumatic.  Eyes:     Conjunctiva/sclera: Conjunctivae normal.  Cardiovascular:     Rate and Rhythm: Normal rate and regular rhythm.     Heart sounds: No murmur.  Pulmonary:     Effort: Pulmonary effort is normal. No respiratory distress.     Breath sounds: Normal breath sounds. No stridor. No wheezing.     Comments: No increase work of breathing, speaking full sentences Abdominal:     Palpations: Abdomen is soft.     Tenderness: There is no abdominal tenderness.  Musculoskeletal:     Cervical back: Neck supple.  Skin:    General: Skin is warm and dry.  Neurological:     Mental Status: He is alert.     ED Results / Procedures / Treatments   Labs (all labs ordered are listed, but only abnormal results are displayed) Labs Reviewed  CBC WITH DIFFERENTIAL/PLATELET - Abnormal; Notable for the following components:      Result Value   Platelets 414 (*)    Abs Immature Granulocytes 0.11 (*)    All other components within normal limits  BASIC METABOLIC PANEL - Abnormal; Notable for the following components:   Potassium 3.0 (*)    Chloride 97 (*)    Glucose, Bld 134 (*)    Anion gap 16 (*)    All other  components within normal limits  URINALYSIS, ROUTINE W REFLEX MICROSCOPIC - Abnormal; Notable for the following components:   Ketones, ur 15 (*)    All other components within normal limits  NOVEL CORONAVIRUS, NAA (HOSP ORDER, SEND-OUT TO REF LAB; TAT 18-24 HRS)    EKG None  Radiology DG Chest Portable 1 View  Result Date: 08/02/2019 CLINICAL DATA:  Cough, shortness of breath, chills EXAM: PORTABLE CHEST 1 VIEW COMPARISON:  06/17/2013 FINDINGS: There  is bilateral lower lobe airspace disease. There is no pleural effusion or pneumothorax. The heart and mediastinal contours are unremarkable. There is no acute osseous abnormality. IMPRESSION: Bilateral lower lobe airspace disease concerning for pneumonia. Electronically Signed   By: Kathreen Devoid   On: 08/02/2019 20:54    Procedures Procedures (including critical care time)  Medications Ordered in ED Medications - No data to display  ED Course  I have reviewed the triage vital signs and the nursing notes.  Pertinent labs & imaging results that were available during my care of the patient were reviewed by me and considered in my medical decision making (see chart for details).    MDM Rules/Calculators/A&P                      68 year old male presents to ER with flulike symptoms, Covid exposure.  High clinical suspicion for COVID-19.  Believe this explains his symptoms well.  His CXR is consistent with atypical pneumonia.  Again likely due to Covid.  His point-of-care test at an outside facility was negative, will send for PCR test here.  Will cover with course of antibiotics, particularly in case if his Covid was negative.  Gave Rx for doxycycline.  He is not hypoxic, has no tachypnea, believe he is appropriate for outpatient management at this time.  Reviewed strict return precautions with patient though should he develop but difficulty in breathing.  Additionally recommended virtual recheck with his primary doctor.    After the  discussed management above, the patient was determined to be safe for discharge.  The patient was in agreement with this plan and all questions regarding their care were answered.  ED return precautions were discussed and the patient will return to the ED with any significant worsening of condition.   Final Clinical Impression(s) / ED Diagnoses Final diagnoses:  Suspected COVID-19 virus infection  Pneumonia due to infectious organism, unspecified laterality, unspecified part of lung    Rx / DC Orders ED Discharge Orders         Ordered    doxycycline (VIBRAMYCIN) 100 MG capsule  2 times daily     08/02/19 2147           Lucrezia Starch, MD 08/02/19 2316

## 2019-08-04 LAB — NOVEL CORONAVIRUS, NAA (HOSP ORDER, SEND-OUT TO REF LAB; TAT 18-24 HRS): SARS-CoV-2, NAA: NOT DETECTED

## 2019-08-17 DIAGNOSIS — Z03818 Encounter for observation for suspected exposure to other biological agents ruled out: Secondary | ICD-10-CM | POA: Diagnosis not present

## 2019-08-17 DIAGNOSIS — Z20828 Contact with and (suspected) exposure to other viral communicable diseases: Secondary | ICD-10-CM | POA: Diagnosis not present

## 2019-09-14 DIAGNOSIS — M47816 Spondylosis without myelopathy or radiculopathy, lumbar region: Secondary | ICD-10-CM | POA: Diagnosis not present

## 2019-09-14 DIAGNOSIS — M545 Low back pain: Secondary | ICD-10-CM | POA: Diagnosis not present

## 2019-09-27 DIAGNOSIS — M5136 Other intervertebral disc degeneration, lumbar region: Secondary | ICD-10-CM | POA: Diagnosis not present

## 2019-10-06 DIAGNOSIS — H04123 Dry eye syndrome of bilateral lacrimal glands: Secondary | ICD-10-CM | POA: Diagnosis not present

## 2019-10-06 DIAGNOSIS — H401121 Primary open-angle glaucoma, left eye, mild stage: Secondary | ICD-10-CM | POA: Diagnosis not present

## 2019-10-06 DIAGNOSIS — H40011 Open angle with borderline findings, low risk, right eye: Secondary | ICD-10-CM | POA: Diagnosis not present

## 2019-11-07 DIAGNOSIS — M109 Gout, unspecified: Secondary | ICD-10-CM | POA: Diagnosis not present

## 2019-11-07 DIAGNOSIS — G4733 Obstructive sleep apnea (adult) (pediatric): Secondary | ICD-10-CM | POA: Diagnosis not present

## 2019-11-07 DIAGNOSIS — H9190 Unspecified hearing loss, unspecified ear: Secondary | ICD-10-CM | POA: Diagnosis not present

## 2019-11-07 DIAGNOSIS — Z7984 Long term (current) use of oral hypoglycemic drugs: Secondary | ICD-10-CM | POA: Diagnosis not present

## 2019-11-07 DIAGNOSIS — I10 Essential (primary) hypertension: Secondary | ICD-10-CM | POA: Diagnosis not present

## 2019-11-07 DIAGNOSIS — G4700 Insomnia, unspecified: Secondary | ICD-10-CM | POA: Diagnosis not present

## 2019-11-07 DIAGNOSIS — E538 Deficiency of other specified B group vitamins: Secondary | ICD-10-CM | POA: Diagnosis not present

## 2019-11-07 DIAGNOSIS — N529 Male erectile dysfunction, unspecified: Secondary | ICD-10-CM | POA: Diagnosis not present

## 2019-11-07 DIAGNOSIS — F411 Generalized anxiety disorder: Secondary | ICD-10-CM | POA: Diagnosis not present

## 2019-11-07 DIAGNOSIS — E785 Hyperlipidemia, unspecified: Secondary | ICD-10-CM | POA: Diagnosis not present

## 2019-11-07 DIAGNOSIS — K219 Gastro-esophageal reflux disease without esophagitis: Secondary | ICD-10-CM | POA: Diagnosis not present

## 2019-11-07 DIAGNOSIS — E1140 Type 2 diabetes mellitus with diabetic neuropathy, unspecified: Secondary | ICD-10-CM | POA: Diagnosis not present

## 2019-11-16 DIAGNOSIS — M7918 Myalgia, other site: Secondary | ICD-10-CM | POA: Diagnosis not present

## 2019-11-16 DIAGNOSIS — M4326 Fusion of spine, lumbar region: Secondary | ICD-10-CM | POA: Diagnosis not present

## 2019-11-16 DIAGNOSIS — M5136 Other intervertebral disc degeneration, lumbar region: Secondary | ICD-10-CM | POA: Diagnosis not present

## 2019-11-16 DIAGNOSIS — I10 Essential (primary) hypertension: Secondary | ICD-10-CM | POA: Diagnosis not present

## 2019-11-16 DIAGNOSIS — M47816 Spondylosis without myelopathy or radiculopathy, lumbar region: Secondary | ICD-10-CM | POA: Diagnosis not present

## 2019-11-23 ENCOUNTER — Other Ambulatory Visit: Payer: Self-pay | Admitting: Orthopaedic Surgery

## 2019-11-23 DIAGNOSIS — M5136 Other intervertebral disc degeneration, lumbar region: Secondary | ICD-10-CM

## 2019-12-22 DIAGNOSIS — E890 Postprocedural hypothyroidism: Secondary | ICD-10-CM | POA: Diagnosis not present

## 2019-12-22 DIAGNOSIS — E1140 Type 2 diabetes mellitus with diabetic neuropathy, unspecified: Secondary | ICD-10-CM | POA: Diagnosis not present

## 2019-12-22 DIAGNOSIS — E118 Type 2 diabetes mellitus with unspecified complications: Secondary | ICD-10-CM | POA: Diagnosis not present

## 2019-12-23 DIAGNOSIS — M5136 Other intervertebral disc degeneration, lumbar region: Secondary | ICD-10-CM | POA: Diagnosis not present

## 2019-12-23 DIAGNOSIS — M4326 Fusion of spine, lumbar region: Secondary | ICD-10-CM | POA: Diagnosis not present

## 2019-12-23 DIAGNOSIS — M7918 Myalgia, other site: Secondary | ICD-10-CM | POA: Diagnosis not present

## 2019-12-23 DIAGNOSIS — M545 Low back pain: Secondary | ICD-10-CM | POA: Diagnosis not present

## 2019-12-23 DIAGNOSIS — Z6830 Body mass index (BMI) 30.0-30.9, adult: Secondary | ICD-10-CM | POA: Diagnosis not present

## 2019-12-29 DIAGNOSIS — E041 Nontoxic single thyroid nodule: Secondary | ICD-10-CM | POA: Diagnosis not present

## 2019-12-29 DIAGNOSIS — Z6833 Body mass index (BMI) 33.0-33.9, adult: Secondary | ICD-10-CM | POA: Diagnosis not present

## 2019-12-29 DIAGNOSIS — E782 Mixed hyperlipidemia: Secondary | ICD-10-CM | POA: Diagnosis not present

## 2019-12-29 DIAGNOSIS — E1140 Type 2 diabetes mellitus with diabetic neuropathy, unspecified: Secondary | ICD-10-CM | POA: Diagnosis not present

## 2019-12-29 DIAGNOSIS — E890 Postprocedural hypothyroidism: Secondary | ICD-10-CM | POA: Diagnosis not present

## 2020-01-03 ENCOUNTER — Other Ambulatory Visit: Payer: Self-pay

## 2020-01-03 ENCOUNTER — Ambulatory Visit
Admission: RE | Admit: 2020-01-03 | Discharge: 2020-01-03 | Disposition: A | Discharge status: Home / Self Care | Payer: Medicare Other | Source: Ambulatory Visit | Attending: Orthopaedic Surgery | Admitting: Orthopaedic Surgery

## 2020-01-03 DIAGNOSIS — M5136 Other intervertebral disc degeneration, lumbar region: Secondary | ICD-10-CM

## 2020-01-03 DIAGNOSIS — M48061 Spinal stenosis, lumbar region without neurogenic claudication: Secondary | ICD-10-CM | POA: Diagnosis not present

## 2020-01-03 MED ORDER — GADOBENATE DIMEGLUMINE 529 MG/ML IV SOLN
20.0000 mL | Freq: Once | INTRAVENOUS | Status: AC | PRN
  Administered 2020-01-03: 20 mL via INTRAVENOUS

## 2020-01-18 DIAGNOSIS — M4326 Fusion of spine, lumbar region: Secondary | ICD-10-CM | POA: Diagnosis not present

## 2020-01-18 DIAGNOSIS — T84296A Other mechanical complication of internal fixation device of vertebrae, initial encounter: Secondary | ICD-10-CM | POA: Diagnosis not present

## 2020-01-18 DIAGNOSIS — M48062 Spinal stenosis, lumbar region with neurogenic claudication: Secondary | ICD-10-CM | POA: Diagnosis not present

## 2020-01-18 DIAGNOSIS — M4716 Other spondylosis with myelopathy, lumbar region: Secondary | ICD-10-CM | POA: Diagnosis not present

## 2020-02-02 DIAGNOSIS — I4510 Unspecified right bundle-branch block: Secondary | ICD-10-CM | POA: Diagnosis not present

## 2020-02-07 DIAGNOSIS — M2578 Osteophyte, vertebrae: Secondary | ICD-10-CM | POA: Diagnosis not present

## 2020-02-07 DIAGNOSIS — Z981 Arthrodesis status: Secondary | ICD-10-CM | POA: Diagnosis not present

## 2020-02-07 DIAGNOSIS — M5116 Intervertebral disc disorders with radiculopathy, lumbar region: Secondary | ICD-10-CM | POA: Diagnosis not present

## 2020-02-07 DIAGNOSIS — M4726 Other spondylosis with radiculopathy, lumbar region: Secondary | ICD-10-CM | POA: Diagnosis not present

## 2020-02-07 DIAGNOSIS — M4325 Fusion of spine, thoracolumbar region: Secondary | ICD-10-CM | POA: Diagnosis not present

## 2020-02-07 DIAGNOSIS — G9612 Meningeal adhesions (cerebral) (spinal): Secondary | ICD-10-CM | POA: Diagnosis not present

## 2020-02-07 DIAGNOSIS — E119 Type 2 diabetes mellitus without complications: Secondary | ICD-10-CM | POA: Diagnosis not present

## 2020-02-07 DIAGNOSIS — M48062 Spinal stenosis, lumbar region with neurogenic claudication: Secondary | ICD-10-CM | POA: Diagnosis not present

## 2020-02-08 DIAGNOSIS — M4726 Other spondylosis with radiculopathy, lumbar region: Secondary | ICD-10-CM | POA: Diagnosis not present

## 2020-02-08 DIAGNOSIS — M5116 Intervertebral disc disorders with radiculopathy, lumbar region: Secondary | ICD-10-CM | POA: Diagnosis not present

## 2020-02-08 DIAGNOSIS — M2578 Osteophyte, vertebrae: Secondary | ICD-10-CM | POA: Diagnosis not present

## 2020-02-08 DIAGNOSIS — G9612 Meningeal adhesions (cerebral) (spinal): Secondary | ICD-10-CM | POA: Diagnosis not present

## 2020-02-08 DIAGNOSIS — M48062 Spinal stenosis, lumbar region with neurogenic claudication: Secondary | ICD-10-CM | POA: Diagnosis not present

## 2020-02-08 DIAGNOSIS — Z981 Arthrodesis status: Secondary | ICD-10-CM | POA: Diagnosis not present

## 2020-02-09 DIAGNOSIS — M48062 Spinal stenosis, lumbar region with neurogenic claudication: Secondary | ICD-10-CM | POA: Diagnosis not present

## 2020-02-09 DIAGNOSIS — M2578 Osteophyte, vertebrae: Secondary | ICD-10-CM | POA: Diagnosis not present

## 2020-02-09 DIAGNOSIS — Z981 Arthrodesis status: Secondary | ICD-10-CM | POA: Diagnosis not present

## 2020-02-09 DIAGNOSIS — G9612 Meningeal adhesions (cerebral) (spinal): Secondary | ICD-10-CM | POA: Diagnosis not present

## 2020-02-09 DIAGNOSIS — M4726 Other spondylosis with radiculopathy, lumbar region: Secondary | ICD-10-CM | POA: Diagnosis not present

## 2020-02-09 DIAGNOSIS — M5116 Intervertebral disc disorders with radiculopathy, lumbar region: Secondary | ICD-10-CM | POA: Diagnosis not present

## 2020-03-02 DIAGNOSIS — Z981 Arthrodesis status: Secondary | ICD-10-CM | POA: Diagnosis not present

## 2020-03-02 DIAGNOSIS — M4326 Fusion of spine, lumbar region: Secondary | ICD-10-CM | POA: Diagnosis not present

## 2020-03-22 DIAGNOSIS — E1140 Type 2 diabetes mellitus with diabetic neuropathy, unspecified: Secondary | ICD-10-CM | POA: Diagnosis not present

## 2020-03-22 DIAGNOSIS — E890 Postprocedural hypothyroidism: Secondary | ICD-10-CM | POA: Diagnosis not present

## 2020-04-05 DIAGNOSIS — M4726 Other spondylosis with radiculopathy, lumbar region: Secondary | ICD-10-CM | POA: Diagnosis not present

## 2020-04-05 DIAGNOSIS — M545 Low back pain: Secondary | ICD-10-CM | POA: Diagnosis not present

## 2020-04-10 DIAGNOSIS — M545 Low back pain: Secondary | ICD-10-CM | POA: Diagnosis not present

## 2020-04-10 DIAGNOSIS — M4726 Other spondylosis with radiculopathy, lumbar region: Secondary | ICD-10-CM | POA: Diagnosis not present

## 2020-04-10 DIAGNOSIS — H04123 Dry eye syndrome of bilateral lacrimal glands: Secondary | ICD-10-CM | POA: Diagnosis not present

## 2020-04-10 DIAGNOSIS — E119 Type 2 diabetes mellitus without complications: Secondary | ICD-10-CM | POA: Diagnosis not present

## 2020-04-10 DIAGNOSIS — H401121 Primary open-angle glaucoma, left eye, mild stage: Secondary | ICD-10-CM | POA: Diagnosis not present

## 2020-04-10 DIAGNOSIS — H40011 Open angle with borderline findings, low risk, right eye: Secondary | ICD-10-CM | POA: Diagnosis not present

## 2020-04-12 DIAGNOSIS — M545 Low back pain: Secondary | ICD-10-CM | POA: Diagnosis not present

## 2020-04-12 DIAGNOSIS — M4726 Other spondylosis with radiculopathy, lumbar region: Secondary | ICD-10-CM | POA: Diagnosis not present

## 2020-04-17 DIAGNOSIS — M4726 Other spondylosis with radiculopathy, lumbar region: Secondary | ICD-10-CM | POA: Diagnosis not present

## 2020-04-17 DIAGNOSIS — M545 Low back pain: Secondary | ICD-10-CM | POA: Diagnosis not present

## 2020-04-18 DIAGNOSIS — E890 Postprocedural hypothyroidism: Secondary | ICD-10-CM | POA: Diagnosis not present

## 2020-04-18 DIAGNOSIS — E041 Nontoxic single thyroid nodule: Secondary | ICD-10-CM | POA: Diagnosis not present

## 2020-04-18 DIAGNOSIS — E1140 Type 2 diabetes mellitus with diabetic neuropathy, unspecified: Secondary | ICD-10-CM | POA: Diagnosis not present

## 2020-04-18 DIAGNOSIS — E870 Hyperosmolality and hypernatremia: Secondary | ICD-10-CM | POA: Diagnosis not present

## 2020-04-18 DIAGNOSIS — Z6831 Body mass index (BMI) 31.0-31.9, adult: Secondary | ICD-10-CM | POA: Diagnosis not present

## 2020-04-18 DIAGNOSIS — E782 Mixed hyperlipidemia: Secondary | ICD-10-CM | POA: Diagnosis not present

## 2020-04-19 DIAGNOSIS — M4726 Other spondylosis with radiculopathy, lumbar region: Secondary | ICD-10-CM | POA: Diagnosis not present

## 2020-04-19 DIAGNOSIS — M545 Low back pain: Secondary | ICD-10-CM | POA: Diagnosis not present

## 2020-04-22 DIAGNOSIS — Z23 Encounter for immunization: Secondary | ICD-10-CM | POA: Diagnosis not present

## 2020-04-24 DIAGNOSIS — M545 Low back pain: Secondary | ICD-10-CM | POA: Diagnosis not present

## 2020-04-24 DIAGNOSIS — M4726 Other spondylosis with radiculopathy, lumbar region: Secondary | ICD-10-CM | POA: Diagnosis not present

## 2020-04-26 DIAGNOSIS — M4726 Other spondylosis with radiculopathy, lumbar region: Secondary | ICD-10-CM | POA: Diagnosis not present

## 2020-04-26 DIAGNOSIS — M545 Low back pain: Secondary | ICD-10-CM | POA: Diagnosis not present

## 2020-04-28 DIAGNOSIS — Z23 Encounter for immunization: Secondary | ICD-10-CM | POA: Diagnosis not present

## 2020-05-01 DIAGNOSIS — M545 Low back pain: Secondary | ICD-10-CM | POA: Diagnosis not present

## 2020-05-01 DIAGNOSIS — M4726 Other spondylosis with radiculopathy, lumbar region: Secondary | ICD-10-CM | POA: Diagnosis not present

## 2020-05-02 DIAGNOSIS — M5106 Intervertebral disc disorders with myelopathy, lumbar region: Secondary | ICD-10-CM | POA: Diagnosis not present

## 2020-05-02 DIAGNOSIS — M4326 Fusion of spine, lumbar region: Secondary | ICD-10-CM | POA: Diagnosis not present

## 2020-05-02 DIAGNOSIS — M7918 Myalgia, other site: Secondary | ICD-10-CM | POA: Diagnosis not present

## 2020-05-02 DIAGNOSIS — M48062 Spinal stenosis, lumbar region with neurogenic claudication: Secondary | ICD-10-CM | POA: Diagnosis not present

## 2020-05-02 DIAGNOSIS — Z6834 Body mass index (BMI) 34.0-34.9, adult: Secondary | ICD-10-CM | POA: Diagnosis not present

## 2020-05-02 DIAGNOSIS — M542 Cervicalgia: Secondary | ICD-10-CM | POA: Diagnosis not present

## 2020-05-04 DIAGNOSIS — M4726 Other spondylosis with radiculopathy, lumbar region: Secondary | ICD-10-CM | POA: Diagnosis not present

## 2020-05-04 DIAGNOSIS — M545 Low back pain, unspecified: Secondary | ICD-10-CM | POA: Diagnosis not present

## 2020-05-08 DIAGNOSIS — E1140 Type 2 diabetes mellitus with diabetic neuropathy, unspecified: Secondary | ICD-10-CM | POA: Diagnosis not present

## 2020-05-08 DIAGNOSIS — E785 Hyperlipidemia, unspecified: Secondary | ICD-10-CM | POA: Diagnosis not present

## 2020-05-08 DIAGNOSIS — E039 Hypothyroidism, unspecified: Secondary | ICD-10-CM | POA: Diagnosis not present

## 2020-05-08 DIAGNOSIS — I10 Essential (primary) hypertension: Secondary | ICD-10-CM | POA: Diagnosis not present

## 2020-05-08 DIAGNOSIS — M4726 Other spondylosis with radiculopathy, lumbar region: Secondary | ICD-10-CM | POA: Diagnosis not present

## 2020-05-08 DIAGNOSIS — Z79899 Other long term (current) drug therapy: Secondary | ICD-10-CM | POA: Diagnosis not present

## 2020-05-08 DIAGNOSIS — Z794 Long term (current) use of insulin: Secondary | ICD-10-CM | POA: Diagnosis not present

## 2020-05-08 DIAGNOSIS — F411 Generalized anxiety disorder: Secondary | ICD-10-CM | POA: Diagnosis not present

## 2020-05-08 DIAGNOSIS — M545 Low back pain, unspecified: Secondary | ICD-10-CM | POA: Diagnosis not present

## 2020-05-08 DIAGNOSIS — Z125 Encounter for screening for malignant neoplasm of prostate: Secondary | ICD-10-CM | POA: Diagnosis not present

## 2020-05-08 DIAGNOSIS — G4700 Insomnia, unspecified: Secondary | ICD-10-CM | POA: Diagnosis not present

## 2020-05-08 DIAGNOSIS — M109 Gout, unspecified: Secondary | ICD-10-CM | POA: Diagnosis not present

## 2020-05-11 DIAGNOSIS — M545 Low back pain, unspecified: Secondary | ICD-10-CM | POA: Diagnosis not present

## 2020-05-11 DIAGNOSIS — M4726 Other spondylosis with radiculopathy, lumbar region: Secondary | ICD-10-CM | POA: Diagnosis not present

## 2020-05-15 DIAGNOSIS — M545 Low back pain, unspecified: Secondary | ICD-10-CM | POA: Diagnosis not present

## 2020-05-15 DIAGNOSIS — M4726 Other spondylosis with radiculopathy, lumbar region: Secondary | ICD-10-CM | POA: Diagnosis not present

## 2020-05-22 DIAGNOSIS — M545 Low back pain, unspecified: Secondary | ICD-10-CM | POA: Diagnosis not present

## 2020-05-22 DIAGNOSIS — M4726 Other spondylosis with radiculopathy, lumbar region: Secondary | ICD-10-CM | POA: Diagnosis not present

## 2020-05-25 DIAGNOSIS — M4726 Other spondylosis with radiculopathy, lumbar region: Secondary | ICD-10-CM | POA: Diagnosis not present

## 2020-05-25 DIAGNOSIS — M545 Low back pain, unspecified: Secondary | ICD-10-CM | POA: Diagnosis not present

## 2020-05-28 DIAGNOSIS — G4733 Obstructive sleep apnea (adult) (pediatric): Secondary | ICD-10-CM | POA: Diagnosis not present

## 2020-05-29 DIAGNOSIS — M4726 Other spondylosis with radiculopathy, lumbar region: Secondary | ICD-10-CM | POA: Diagnosis not present

## 2020-05-29 DIAGNOSIS — M545 Low back pain, unspecified: Secondary | ICD-10-CM | POA: Diagnosis not present

## 2020-06-12 IMAGING — DX DG CHEST 1V PORT
1 series · 1 of 1 positions shown · non-contrast
Comparison: 06/17/2013

CLINICAL DATA: Cough, shortness of breath, chills

EXAM:
PORTABLE CHEST 1 VIEW

[chest ap]
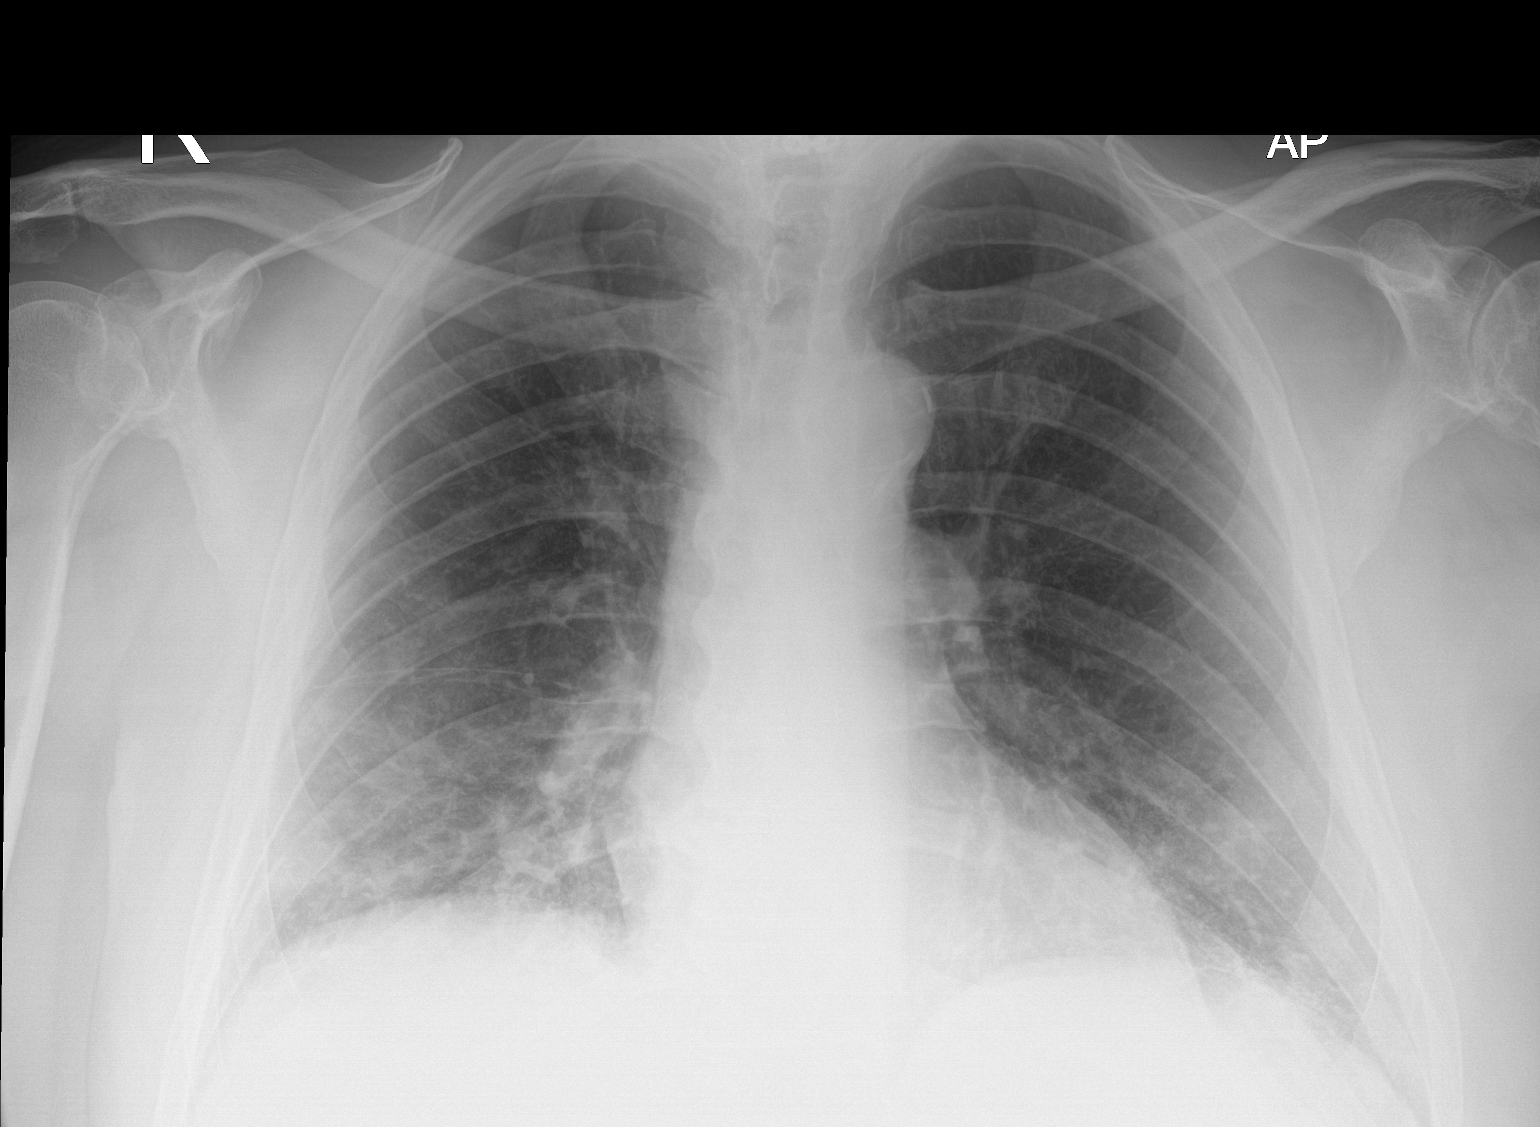

[1 of 1 positions shown; findings below may reference images not displayed]

FINDINGS: There is bilateral lower lobe airspace disease. There is no pleural
effusion or pneumothorax. The heart and mediastinal contours are
unremarkable.

There is no acute osseous abnormality.
IMPRESSION: Bilateral lower lobe airspace disease concerning for pneumonia.

## 2020-07-17 DIAGNOSIS — M48062 Spinal stenosis, lumbar region with neurogenic claudication: Secondary | ICD-10-CM | POA: Diagnosis not present

## 2020-07-17 DIAGNOSIS — M7918 Myalgia, other site: Secondary | ICD-10-CM | POA: Diagnosis not present

## 2020-07-17 DIAGNOSIS — M4326 Fusion of spine, lumbar region: Secondary | ICD-10-CM | POA: Diagnosis not present

## 2020-07-17 DIAGNOSIS — M546 Pain in thoracic spine: Secondary | ICD-10-CM | POA: Diagnosis not present

## 2020-07-20 ENCOUNTER — Other Ambulatory Visit: Payer: Self-pay | Admitting: Rehabilitation

## 2020-07-20 ENCOUNTER — Telehealth: Payer: Self-pay

## 2020-07-20 DIAGNOSIS — M546 Pain in thoracic spine: Secondary | ICD-10-CM

## 2020-07-20 NOTE — Telephone Encounter (Signed)
Entered in error

## 2020-07-20 NOTE — Telephone Encounter (Signed)
Phone call to patient to verify medication list and allergies for myelogram procedure. Pt instructed to hold PROZAC  for 48hrs prior to myelogram appointment time and 24 hours after appointment. Pt also instructed to have a driver the day of the procedure, the procedure would take around 2 hours, and discharge instructions discussed. Pt verbalized understanding.

## 2020-08-02 ENCOUNTER — Ambulatory Visit
Admission: RE | Admit: 2020-08-02 | Discharge: 2020-08-02 | Disposition: A | Discharge status: Home / Self Care | Payer: Medicare Other | Source: Ambulatory Visit | Attending: Rehabilitation | Admitting: Rehabilitation

## 2020-08-02 ENCOUNTER — Other Ambulatory Visit: Payer: Self-pay

## 2020-08-02 DIAGNOSIS — M546 Pain in thoracic spine: Secondary | ICD-10-CM

## 2020-08-02 MED ORDER — DIAZEPAM 5 MG PO TABS
5.0000 mg | ORAL_TABLET | Freq: Once | ORAL | Status: AC
  Administered 2020-08-02: 5 mg via ORAL

## 2020-08-02 MED ORDER — ONDANSETRON HCL 4 MG/2ML IJ SOLN: 4.0000 mg | Freq: Four times a day (QID) | INTRAMUSCULAR | Status: DC | PRN

## 2020-08-02 MED ORDER — IOPAMIDOL (ISOVUE-M 300) INJECTION 61%
10.0000 mL | Freq: Once | INTRAMUSCULAR | Status: AC | PRN
  Administered 2020-08-02: 10 mL via INTRATHECAL

## 2020-08-02 NOTE — Progress Notes (Signed)
Pt reports he has been off of his prozac for at least 48 hours.

## 2020-08-02 NOTE — Discharge Instructions (Signed)
Myelogram Discharge Instructions  1. Go home and rest quietly for the next 24 hours.  It is important to lie flat for the next 24 hours.  Get up only to go to the restroom.  You may lie in the bed or on a couch on your back, your stomach, your left side or your right side.  You may have one pillow under your head.  You may have pillows between your knees while you are on your side or under your knees while you are on your back.  2. DO NOT drive today.  Recline the seat as far back as it will go, while still wearing your seat belt, on the way home.  3. You may get up to go to the bathroom as needed.  You may sit up for 10 minutes to eat.  You may resume your normal diet and medications unless otherwise indicated.  Drink lots of extra fluids today and tomorrow.  4. The incidence of headache, nausea, or vomiting is about 5% (one in 20 patients).  If you develop a headache, lie flat and drink plenty of fluids until the headache goes away.  Caffeinated beverages may be helpful.  If you develop severe nausea and vomiting or a headache that does not go away with flat bed rest, call [phone].  5. You may resume normal activities after your 24 hours of bed rest is over; however, do not exert yourself strongly or do any heavy lifting tomorrow. If when you get up you have a headache when standing, go back to bed and force fluids for another 24 hours.  6. Call your physician for a follow-up appointment.  The results of your myelogram will be sent directly to your physician by the following day.  7. If you have any questions or if complications develop after you arrive home, please call [phone].  Discharge instructions have been explained to the patient.  The patient, or the person responsible for the patient, fully understands these instructions  YOU MAY TAKE YOUR PROZAC TOMORROW ON 08/03/20 AT 10:30AM OR ANY TIME AFTER.

## 2020-08-21 DIAGNOSIS — M47812 Spondylosis without myelopathy or radiculopathy, cervical region: Secondary | ICD-10-CM | POA: Diagnosis not present

## 2020-09-05 DIAGNOSIS — M47814 Spondylosis without myelopathy or radiculopathy, thoracic region: Secondary | ICD-10-CM | POA: Diagnosis not present

## 2020-10-01 DIAGNOSIS — M47814 Spondylosis without myelopathy or radiculopathy, thoracic region: Secondary | ICD-10-CM | POA: Diagnosis not present

## 2020-10-01 DIAGNOSIS — M47816 Spondylosis without myelopathy or radiculopathy, lumbar region: Secondary | ICD-10-CM | POA: Diagnosis not present

## 2020-10-09 DIAGNOSIS — H40011 Open angle with borderline findings, low risk, right eye: Secondary | ICD-10-CM | POA: Diagnosis not present

## 2020-10-09 DIAGNOSIS — H04123 Dry eye syndrome of bilateral lacrimal glands: Secondary | ICD-10-CM | POA: Diagnosis not present

## 2020-10-09 DIAGNOSIS — H401121 Primary open-angle glaucoma, left eye, mild stage: Secondary | ICD-10-CM | POA: Diagnosis not present

## 2020-10-29 DIAGNOSIS — M47812 Spondylosis without myelopathy or radiculopathy, cervical region: Secondary | ICD-10-CM | POA: Diagnosis not present

## 2020-10-29 DIAGNOSIS — M47814 Spondylosis without myelopathy or radiculopathy, thoracic region: Secondary | ICD-10-CM | POA: Diagnosis not present

## 2020-10-29 DIAGNOSIS — M4326 Fusion of spine, lumbar region: Secondary | ICD-10-CM | POA: Diagnosis not present

## 2020-10-29 DIAGNOSIS — M791 Myalgia, unspecified site: Secondary | ICD-10-CM | POA: Diagnosis not present

## 2020-11-06 DIAGNOSIS — M109 Gout, unspecified: Secondary | ICD-10-CM | POA: Diagnosis not present

## 2020-11-06 DIAGNOSIS — Z0000 Encounter for general adult medical examination without abnormal findings: Secondary | ICD-10-CM | POA: Diagnosis not present

## 2020-11-06 DIAGNOSIS — E1140 Type 2 diabetes mellitus with diabetic neuropathy, unspecified: Secondary | ICD-10-CM | POA: Diagnosis not present

## 2020-11-06 DIAGNOSIS — F411 Generalized anxiety disorder: Secondary | ICD-10-CM | POA: Diagnosis not present

## 2020-11-06 DIAGNOSIS — I10 Essential (primary) hypertension: Secondary | ICD-10-CM | POA: Diagnosis not present

## 2020-11-06 DIAGNOSIS — G4700 Insomnia, unspecified: Secondary | ICD-10-CM | POA: Diagnosis not present

## 2020-11-06 DIAGNOSIS — E039 Hypothyroidism, unspecified: Secondary | ICD-10-CM | POA: Diagnosis not present

## 2020-11-06 DIAGNOSIS — Z7984 Long term (current) use of oral hypoglycemic drugs: Secondary | ICD-10-CM | POA: Diagnosis not present

## 2020-11-06 DIAGNOSIS — E785 Hyperlipidemia, unspecified: Secondary | ICD-10-CM | POA: Diagnosis not present

## 2020-11-06 DIAGNOSIS — Z23 Encounter for immunization: Secondary | ICD-10-CM | POA: Diagnosis not present

## 2020-11-08 DIAGNOSIS — Z23 Encounter for immunization: Secondary | ICD-10-CM | POA: Diagnosis not present

## 2021-01-31 DIAGNOSIS — M47814 Spondylosis without myelopathy or radiculopathy, thoracic region: Secondary | ICD-10-CM | POA: Diagnosis not present

## 2021-01-31 DIAGNOSIS — M4326 Fusion of spine, lumbar region: Secondary | ICD-10-CM | POA: Diagnosis not present

## 2021-01-31 DIAGNOSIS — Z6834 Body mass index (BMI) 34.0-34.9, adult: Secondary | ICD-10-CM | POA: Diagnosis not present

## 2021-01-31 DIAGNOSIS — M791 Myalgia, unspecified site: Secondary | ICD-10-CM | POA: Diagnosis not present

## 2021-03-28 DIAGNOSIS — M4326 Fusion of spine, lumbar region: Secondary | ICD-10-CM | POA: Diagnosis not present

## 2021-03-28 DIAGNOSIS — M791 Myalgia, unspecified site: Secondary | ICD-10-CM | POA: Diagnosis not present

## 2021-03-28 DIAGNOSIS — M47814 Spondylosis without myelopathy or radiculopathy, thoracic region: Secondary | ICD-10-CM | POA: Diagnosis not present

## 2021-04-09 DIAGNOSIS — H40011 Open angle with borderline findings, low risk, right eye: Secondary | ICD-10-CM | POA: Diagnosis not present

## 2021-04-09 DIAGNOSIS — H04123 Dry eye syndrome of bilateral lacrimal glands: Secondary | ICD-10-CM | POA: Diagnosis not present

## 2021-04-09 DIAGNOSIS — H401121 Primary open-angle glaucoma, left eye, mild stage: Secondary | ICD-10-CM | POA: Diagnosis not present

## 2021-04-15 DIAGNOSIS — M47814 Spondylosis without myelopathy or radiculopathy, thoracic region: Secondary | ICD-10-CM | POA: Diagnosis not present

## 2021-05-09 DIAGNOSIS — Z125 Encounter for screening for malignant neoplasm of prostate: Secondary | ICD-10-CM | POA: Diagnosis not present

## 2021-05-09 DIAGNOSIS — E785 Hyperlipidemia, unspecified: Secondary | ICD-10-CM | POA: Diagnosis not present

## 2021-05-09 DIAGNOSIS — Z7984 Long term (current) use of oral hypoglycemic drugs: Secondary | ICD-10-CM | POA: Diagnosis not present

## 2021-05-09 DIAGNOSIS — M109 Gout, unspecified: Secondary | ICD-10-CM | POA: Diagnosis not present

## 2021-05-09 DIAGNOSIS — E039 Hypothyroidism, unspecified: Secondary | ICD-10-CM | POA: Diagnosis not present

## 2021-05-09 DIAGNOSIS — I10 Essential (primary) hypertension: Secondary | ICD-10-CM | POA: Diagnosis not present

## 2021-05-09 DIAGNOSIS — F411 Generalized anxiety disorder: Secondary | ICD-10-CM | POA: Diagnosis not present

## 2021-05-09 DIAGNOSIS — G4700 Insomnia, unspecified: Secondary | ICD-10-CM | POA: Diagnosis not present

## 2021-05-09 DIAGNOSIS — E1140 Type 2 diabetes mellitus with diabetic neuropathy, unspecified: Secondary | ICD-10-CM | POA: Diagnosis not present

## 2021-05-15 DIAGNOSIS — Z23 Encounter for immunization: Secondary | ICD-10-CM | POA: Diagnosis not present

## 2021-05-20 DIAGNOSIS — M47814 Spondylosis without myelopathy or radiculopathy, thoracic region: Secondary | ICD-10-CM | POA: Diagnosis not present

## 2021-05-20 DIAGNOSIS — M791 Myalgia, unspecified site: Secondary | ICD-10-CM | POA: Diagnosis not present

## 2021-05-20 DIAGNOSIS — M4326 Fusion of spine, lumbar region: Secondary | ICD-10-CM | POA: Diagnosis not present

## 2021-07-03 DIAGNOSIS — G4733 Obstructive sleep apnea (adult) (pediatric): Secondary | ICD-10-CM | POA: Diagnosis not present

## 2021-10-31 DIAGNOSIS — Z20822 Contact with and (suspected) exposure to covid-19: Secondary | ICD-10-CM | POA: Diagnosis not present

## 2021-11-04 DIAGNOSIS — H401121 Primary open-angle glaucoma, left eye, mild stage: Secondary | ICD-10-CM | POA: Diagnosis not present

## 2021-11-04 DIAGNOSIS — H18513 Endothelial corneal dystrophy, bilateral: Secondary | ICD-10-CM | POA: Diagnosis not present

## 2021-11-04 DIAGNOSIS — H04123 Dry eye syndrome of bilateral lacrimal glands: Secondary | ICD-10-CM | POA: Diagnosis not present

## 2021-11-04 DIAGNOSIS — H40011 Open angle with borderline findings, low risk, right eye: Secondary | ICD-10-CM | POA: Diagnosis not present

## 2021-11-07 DIAGNOSIS — E039 Hypothyroidism, unspecified: Secondary | ICD-10-CM | POA: Diagnosis not present

## 2021-11-07 DIAGNOSIS — E785 Hyperlipidemia, unspecified: Secondary | ICD-10-CM | POA: Diagnosis not present

## 2021-11-07 DIAGNOSIS — E1140 Type 2 diabetes mellitus with diabetic neuropathy, unspecified: Secondary | ICD-10-CM | POA: Diagnosis not present

## 2021-11-14 DIAGNOSIS — E041 Nontoxic single thyroid nodule: Secondary | ICD-10-CM | POA: Diagnosis not present

## 2021-11-14 DIAGNOSIS — E782 Mixed hyperlipidemia: Secondary | ICD-10-CM | POA: Diagnosis not present

## 2021-11-14 DIAGNOSIS — E1140 Type 2 diabetes mellitus with diabetic neuropathy, unspecified: Secondary | ICD-10-CM | POA: Diagnosis not present

## 2021-11-14 DIAGNOSIS — E890 Postprocedural hypothyroidism: Secondary | ICD-10-CM | POA: Diagnosis not present

## 2021-11-14 DIAGNOSIS — R7989 Other specified abnormal findings of blood chemistry: Secondary | ICD-10-CM | POA: Diagnosis not present

## 2021-11-14 DIAGNOSIS — Z6833 Body mass index (BMI) 33.0-33.9, adult: Secondary | ICD-10-CM | POA: Diagnosis not present

## 2021-11-28 DIAGNOSIS — R7989 Other specified abnormal findings of blood chemistry: Secondary | ICD-10-CM | POA: Diagnosis not present

## 2021-11-28 DIAGNOSIS — K760 Fatty (change of) liver, not elsewhere classified: Secondary | ICD-10-CM | POA: Diagnosis not present

## 2021-11-28 DIAGNOSIS — K8020 Calculus of gallbladder without cholecystitis without obstruction: Secondary | ICD-10-CM | POA: Diagnosis not present

## 2021-12-06 DIAGNOSIS — Z20822 Contact with and (suspected) exposure to covid-19: Secondary | ICD-10-CM | POA: Diagnosis not present

## 2021-12-10 DIAGNOSIS — E785 Hyperlipidemia, unspecified: Secondary | ICD-10-CM | POA: Diagnosis not present

## 2021-12-10 DIAGNOSIS — R945 Abnormal results of liver function studies: Secondary | ICD-10-CM | POA: Diagnosis not present

## 2021-12-10 DIAGNOSIS — I10 Essential (primary) hypertension: Secondary | ICD-10-CM | POA: Diagnosis not present

## 2021-12-10 DIAGNOSIS — Z0000 Encounter for general adult medical examination without abnormal findings: Secondary | ICD-10-CM | POA: Diagnosis not present

## 2021-12-10 DIAGNOSIS — Z7984 Long term (current) use of oral hypoglycemic drugs: Secondary | ICD-10-CM | POA: Diagnosis not present

## 2021-12-10 DIAGNOSIS — G4700 Insomnia, unspecified: Secondary | ICD-10-CM | POA: Diagnosis not present

## 2021-12-10 DIAGNOSIS — E1140 Type 2 diabetes mellitus with diabetic neuropathy, unspecified: Secondary | ICD-10-CM | POA: Diagnosis not present

## 2021-12-10 DIAGNOSIS — M109 Gout, unspecified: Secondary | ICD-10-CM | POA: Diagnosis not present

## 2021-12-10 DIAGNOSIS — F411 Generalized anxiety disorder: Secondary | ICD-10-CM | POA: Diagnosis not present

## 2021-12-10 DIAGNOSIS — R351 Nocturia: Secondary | ICD-10-CM | POA: Diagnosis not present

## 2021-12-10 DIAGNOSIS — E039 Hypothyroidism, unspecified: Secondary | ICD-10-CM | POA: Diagnosis not present

## 2022-01-01 DIAGNOSIS — M4326 Fusion of spine, lumbar region: Secondary | ICD-10-CM | POA: Diagnosis not present

## 2022-01-01 DIAGNOSIS — M7918 Myalgia, other site: Secondary | ICD-10-CM | POA: Diagnosis not present

## 2022-01-01 DIAGNOSIS — M47814 Spondylosis without myelopathy or radiculopathy, thoracic region: Secondary | ICD-10-CM | POA: Diagnosis not present

## 2022-02-13 DIAGNOSIS — E890 Postprocedural hypothyroidism: Secondary | ICD-10-CM | POA: Diagnosis not present

## 2022-02-20 DIAGNOSIS — E782 Mixed hyperlipidemia: Secondary | ICD-10-CM | POA: Diagnosis not present

## 2022-02-20 DIAGNOSIS — K760 Fatty (change of) liver, not elsewhere classified: Secondary | ICD-10-CM | POA: Diagnosis not present

## 2022-02-20 DIAGNOSIS — E1140 Type 2 diabetes mellitus with diabetic neuropathy, unspecified: Secondary | ICD-10-CM | POA: Diagnosis not present

## 2022-02-20 DIAGNOSIS — E039 Hypothyroidism, unspecified: Secondary | ICD-10-CM | POA: Diagnosis not present

## 2022-02-20 DIAGNOSIS — E041 Nontoxic single thyroid nodule: Secondary | ICD-10-CM | POA: Diagnosis not present

## 2022-02-20 DIAGNOSIS — E890 Postprocedural hypothyroidism: Secondary | ICD-10-CM | POA: Diagnosis not present

## 2022-04-08 DIAGNOSIS — M47814 Spondylosis without myelopathy or radiculopathy, thoracic region: Secondary | ICD-10-CM | POA: Diagnosis not present

## 2022-04-08 DIAGNOSIS — M4326 Fusion of spine, lumbar region: Secondary | ICD-10-CM | POA: Diagnosis not present

## 2022-04-08 DIAGNOSIS — M542 Cervicalgia: Secondary | ICD-10-CM | POA: Diagnosis not present

## 2022-04-08 DIAGNOSIS — M7918 Myalgia, other site: Secondary | ICD-10-CM | POA: Diagnosis not present

## 2022-04-16 DIAGNOSIS — M47814 Spondylosis without myelopathy or radiculopathy, thoracic region: Secondary | ICD-10-CM | POA: Diagnosis not present

## 2022-04-16 DIAGNOSIS — M47816 Spondylosis without myelopathy or radiculopathy, lumbar region: Secondary | ICD-10-CM | POA: Diagnosis not present

## 2022-04-16 DIAGNOSIS — M4326 Fusion of spine, lumbar region: Secondary | ICD-10-CM | POA: Diagnosis not present

## 2022-04-17 ENCOUNTER — Other Ambulatory Visit: Payer: Self-pay | Admitting: Rehabilitation

## 2022-04-17 DIAGNOSIS — M47814 Spondylosis without myelopathy or radiculopathy, thoracic region: Secondary | ICD-10-CM

## 2022-05-03 ENCOUNTER — Ambulatory Visit
Admission: RE | Admit: 2022-05-03 | Discharge: 2022-05-03 | Disposition: A | Discharge status: Home / Self Care | Payer: Medicare Other | Source: Ambulatory Visit | Attending: Rehabilitation | Admitting: Rehabilitation

## 2022-05-03 DIAGNOSIS — M546 Pain in thoracic spine: Secondary | ICD-10-CM | POA: Diagnosis not present

## 2022-05-03 DIAGNOSIS — M47814 Spondylosis without myelopathy or radiculopathy, thoracic region: Secondary | ICD-10-CM | POA: Diagnosis not present

## 2022-05-07 DIAGNOSIS — M4326 Fusion of spine, lumbar region: Secondary | ICD-10-CM | POA: Diagnosis not present

## 2022-05-07 DIAGNOSIS — M47814 Spondylosis without myelopathy or radiculopathy, thoracic region: Secondary | ICD-10-CM | POA: Diagnosis not present

## 2022-05-08 DIAGNOSIS — H353131 Nonexudative age-related macular degeneration, bilateral, early dry stage: Secondary | ICD-10-CM | POA: Diagnosis not present

## 2022-05-08 DIAGNOSIS — H401131 Primary open-angle glaucoma, bilateral, mild stage: Secondary | ICD-10-CM | POA: Diagnosis not present

## 2022-05-08 DIAGNOSIS — H04123 Dry eye syndrome of bilateral lacrimal glands: Secondary | ICD-10-CM | POA: Diagnosis not present

## 2022-05-08 DIAGNOSIS — E119 Type 2 diabetes mellitus without complications: Secondary | ICD-10-CM | POA: Diagnosis not present

## 2022-05-09 DIAGNOSIS — Z23 Encounter for immunization: Secondary | ICD-10-CM | POA: Diagnosis not present

## 2022-06-04 DIAGNOSIS — M47814 Spondylosis without myelopathy or radiculopathy, thoracic region: Secondary | ICD-10-CM | POA: Diagnosis not present

## 2022-07-03 DIAGNOSIS — G4733 Obstructive sleep apnea (adult) (pediatric): Secondary | ICD-10-CM | POA: Diagnosis not present

## 2022-07-03 DIAGNOSIS — E1140 Type 2 diabetes mellitus with diabetic neuropathy, unspecified: Secondary | ICD-10-CM | POA: Diagnosis not present

## 2022-08-12 DIAGNOSIS — M4322 Fusion of spine, cervical region: Secondary | ICD-10-CM | POA: Diagnosis not present

## 2022-08-12 DIAGNOSIS — M542 Cervicalgia: Secondary | ICD-10-CM | POA: Diagnosis not present

## 2022-08-12 DIAGNOSIS — M47814 Spondylosis without myelopathy or radiculopathy, thoracic region: Secondary | ICD-10-CM | POA: Diagnosis not present

## 2022-08-12 DIAGNOSIS — M4326 Fusion of spine, lumbar region: Secondary | ICD-10-CM | POA: Diagnosis not present

## 2022-08-12 DIAGNOSIS — M791 Myalgia, unspecified site: Secondary | ICD-10-CM | POA: Diagnosis not present

## 2022-08-25 DIAGNOSIS — E1140 Type 2 diabetes mellitus with diabetic neuropathy, unspecified: Secondary | ICD-10-CM | POA: Diagnosis not present

## 2022-08-25 DIAGNOSIS — E039 Hypothyroidism, unspecified: Secondary | ICD-10-CM | POA: Diagnosis not present

## 2022-08-25 DIAGNOSIS — E782 Mixed hyperlipidemia: Secondary | ICD-10-CM | POA: Diagnosis not present

## 2022-09-01 DIAGNOSIS — E1140 Type 2 diabetes mellitus with diabetic neuropathy, unspecified: Secondary | ICD-10-CM | POA: Diagnosis not present

## 2022-09-01 DIAGNOSIS — E041 Nontoxic single thyroid nodule: Secondary | ICD-10-CM | POA: Diagnosis not present

## 2022-09-01 DIAGNOSIS — E039 Hypothyroidism, unspecified: Secondary | ICD-10-CM | POA: Diagnosis not present

## 2022-09-01 DIAGNOSIS — E782 Mixed hyperlipidemia: Secondary | ICD-10-CM | POA: Diagnosis not present

## 2022-09-01 DIAGNOSIS — E890 Postprocedural hypothyroidism: Secondary | ICD-10-CM | POA: Diagnosis not present

## 2022-09-03 DIAGNOSIS — D125 Benign neoplasm of sigmoid colon: Secondary | ICD-10-CM | POA: Diagnosis not present

## 2022-09-03 DIAGNOSIS — Z09 Encounter for follow-up examination after completed treatment for conditions other than malignant neoplasm: Secondary | ICD-10-CM | POA: Diagnosis not present

## 2022-09-03 DIAGNOSIS — K648 Other hemorrhoids: Secondary | ICD-10-CM | POA: Diagnosis not present

## 2022-09-03 DIAGNOSIS — K5730 Diverticulosis of large intestine without perforation or abscess without bleeding: Secondary | ICD-10-CM | POA: Diagnosis not present

## 2022-09-03 DIAGNOSIS — K5140 Inflammatory polyps of colon without complications: Secondary | ICD-10-CM | POA: Diagnosis not present

## 2022-09-03 DIAGNOSIS — Z86010 Personal history of colonic polyps: Secondary | ICD-10-CM | POA: Diagnosis not present

## 2022-09-05 DIAGNOSIS — K5140 Inflammatory polyps of colon without complications: Secondary | ICD-10-CM | POA: Diagnosis not present

## 2022-09-05 DIAGNOSIS — D125 Benign neoplasm of sigmoid colon: Secondary | ICD-10-CM | POA: Diagnosis not present

## 2022-12-08 DIAGNOSIS — H401131 Primary open-angle glaucoma, bilateral, mild stage: Secondary | ICD-10-CM | POA: Diagnosis not present

## 2023-02-10 DIAGNOSIS — Z23 Encounter for immunization: Secondary | ICD-10-CM | POA: Diagnosis not present

## 2023-02-23 DIAGNOSIS — E1140 Type 2 diabetes mellitus with diabetic neuropathy, unspecified: Secondary | ICD-10-CM | POA: Diagnosis not present

## 2023-02-23 DIAGNOSIS — E890 Postprocedural hypothyroidism: Secondary | ICD-10-CM | POA: Diagnosis not present

## 2023-02-26 DIAGNOSIS — R300 Dysuria: Secondary | ICD-10-CM | POA: Diagnosis not present

## 2023-02-26 DIAGNOSIS — R319 Hematuria, unspecified: Secondary | ICD-10-CM | POA: Diagnosis not present

## 2023-03-04 DIAGNOSIS — M109 Gout, unspecified: Secondary | ICD-10-CM | POA: Diagnosis not present

## 2023-03-04 DIAGNOSIS — E039 Hypothyroidism, unspecified: Secondary | ICD-10-CM | POA: Diagnosis not present

## 2023-03-04 DIAGNOSIS — E1140 Type 2 diabetes mellitus with diabetic neuropathy, unspecified: Secondary | ICD-10-CM | POA: Diagnosis not present

## 2023-03-04 DIAGNOSIS — I10 Essential (primary) hypertension: Secondary | ICD-10-CM | POA: Diagnosis not present

## 2023-03-04 DIAGNOSIS — N3001 Acute cystitis with hematuria: Secondary | ICD-10-CM | POA: Diagnosis not present

## 2023-03-08 DIAGNOSIS — M109 Gout, unspecified: Secondary | ICD-10-CM | POA: Diagnosis not present

## 2023-03-16 DIAGNOSIS — E890 Postprocedural hypothyroidism: Secondary | ICD-10-CM | POA: Diagnosis not present

## 2023-03-16 DIAGNOSIS — K760 Fatty (change of) liver, not elsewhere classified: Secondary | ICD-10-CM | POA: Diagnosis not present

## 2023-03-16 DIAGNOSIS — R209 Unspecified disturbances of skin sensation: Secondary | ICD-10-CM | POA: Diagnosis not present

## 2023-03-16 DIAGNOSIS — E039 Hypothyroidism, unspecified: Secondary | ICD-10-CM | POA: Diagnosis not present

## 2023-03-16 DIAGNOSIS — E1140 Type 2 diabetes mellitus with diabetic neuropathy, unspecified: Secondary | ICD-10-CM | POA: Diagnosis not present

## 2023-03-27 DIAGNOSIS — N3001 Acute cystitis with hematuria: Secondary | ICD-10-CM | POA: Diagnosis not present

## 2023-04-02 DIAGNOSIS — Z23 Encounter for immunization: Secondary | ICD-10-CM | POA: Diagnosis not present

## 2023-04-07 ENCOUNTER — Ambulatory Visit (INDEPENDENT_AMBULATORY_CARE_PROVIDER_SITE_OTHER): Payer: Medicare Other | Admitting: Vascular Surgery

## 2023-04-07 ENCOUNTER — Other Ambulatory Visit (HOSPITAL_COMMUNITY): Payer: Self-pay | Admitting: Vascular Surgery

## 2023-04-07 ENCOUNTER — Encounter: Payer: Self-pay | Admitting: Vascular Surgery

## 2023-04-07 ENCOUNTER — Ambulatory Visit (HOSPITAL_COMMUNITY)
Admission: RE | Admit: 2023-04-07 | Discharge: 2023-04-07 | Disposition: A | Discharge status: Home / Self Care | Payer: Medicare Other | Source: Ambulatory Visit | Attending: Vascular Surgery | Admitting: Vascular Surgery

## 2023-04-07 VITALS — BP 127/78 | HR 77 | Temp 98.6°F | Resp 18 | Ht 68.0 in | Wt 230.7 lb

## 2023-04-07 DIAGNOSIS — R209 Unspecified disturbances of skin sensation: Secondary | ICD-10-CM | POA: Insufficient documentation

## 2023-04-07 DIAGNOSIS — L819 Disorder of pigmentation, unspecified: Secondary | ICD-10-CM | POA: Insufficient documentation

## 2023-04-07 LAB — VAS US ABI WITH/WO TBI

## 2023-04-07 NOTE — Progress Notes (Signed)
Patient name: Allen Mcdaniel MRN: [id number] DOB: 10/19/1950 Sex: male  REASON FOR CONSULT: Bilateral coolness and discoloration in toes  HPI: Allen Mcdaniel is a 72 y.o. male, with history of diabetes and hypothyroidism that presents for evaluation of bilateral coolness and purple discoloration to his toes.  This has been ongoing for at least a year according to his wife.  Both lower extremities equally affected.  No pain with walking.  No open ulcers.  Does not wear compression stockings.  Does have a strong family history of vascular disease in his mom.  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    Past Medical History:  Diagnosis Date   Allergy    Anxiety    Arthritis    Cancer ([facility])    Complication of anesthesia    with back surgery-too much Morphine,O2 too low   Diabetes mellitus without complication ([facility])    H/O hiatal hernia    Hyperlipidemia    Hypertension    Sleep apnea    setting -18   Thyroid disease     Past Surgical History:  Procedure Laterality Date   BACK SURGERY     JOINT REPLACEMENT     left and right knee   SHOULDER SURGERY     THYROID LOBECTOMY Right 06/23/2013   Procedure: THYROID LOBECTOMY;  Surgeon: Velora Heckler, MD;  Location: [facility];  Service: General;  Laterality: Right;   TONSILLECTOMY     as child    No family history on file.  SOCIAL HISTORY: Social History   Socioeconomic History   Marital status: Married    Spouse name: Not on file   Number of children: Not on file   Years of education: Not on file   Highest education level: Not on file  Occupational History   Not on file  Tobacco Use   Smoking status: Never   Smokeless tobacco: Never  Substance and Sexual Activity   Alcohol use: Not Currently    Comment: occassionally   Drug use: No   Sexual activity: Not on file  Other Topics Concern   Not on file  Social History Narrative   Not on file   Social Determinants of Health   Financial Resource Strain: Not on file   Food Insecurity: Not on file  Transportation Needs: Not on file  Physical Activity: Not on file  Stress: Not on file  Social Connections: Not on file  Intimate Partner Violence: Not on file    Allergies  Allergen Reactions   Morphine Nausea And Vomiting    Current Outpatient Medications  Medication Sig Dispense Refill   aspirin 81 MG tablet Take 81 mg by mouth daily.     CELEBREX 200 MG capsule Take 200 mg by mouth once a week.  (Patient not taking: Reported on 07/20/2020)     diazepam (VALIUM) 5 MG tablet Take 5 mg by mouth every 6 (six) hours as needed for anxiety.     enalapril-hydrochlorothiazide (VASERETIC) 10-25 MG per tablet Take 1 tablet by mouth daily.     FLUoxetine (PROZAC) 40 MG capsule Take 40 mg by mouth daily.      HYDROcodone-acetaminophen (NORCO/VICODIN) 5-325 MG tablet Take 1-2 tablets by mouth every 6 (six) hours as needed. (Patient not taking: Reported on 07/20/2020) 6 tablet 0   levothyroxine (SYNTHROID, LEVOTHROID) 25 MCG tablet Take 25 mcg by mouth daily before breakfast.     loratadine (CLARITIN) 10 MG tablet Take 10 mg by  mouth daily. (Patient not taking: Reported on 07/20/2020)     metFORMIN (GLUCOPHAGE) 500 MG tablet Take 500 mg by mouth 2 (two) times daily with a meal.     predniSONE (DELTASONE) 20 MG tablet Take 20 mg by mouth daily with breakfast. Take 3 tablets x 3 days, 2 tablets x 3 days, 1 tablet x 3 days once a day orally for 9 days (Patient not taking: Reported on 07/20/2020)     pregabalin (LYRICA) 50 MG capsule Take 50 mg by mouth 2 (two) times a week. (Patient not taking: Reported on 07/20/2020)     rosuvastatin (CRESTOR) 20 MG tablet Take 10 mg by mouth daily.      zolpidem (AMBIEN) 10 MG tablet Take 10 mg by mouth at bedtime as needed for sleep.     No current facility-administered medications for this visit.    REVIEW OF SYSTEMS:  [X]  denotes positive finding, [ ]  denotes negative finding Cardiac  Comments:  Chest pain or chest pressure:     Shortness of breath upon exertion:    Short of breath when lying flat:    Irregular heart rhythm:        Vascular    Pain in calf, thigh, or hip brought on by ambulation:    Pain in feet at night that wakes you up from your sleep:     Blood clot in your veins:    Leg swelling:         Pulmonary    Oxygen at home:    Productive cough:     Wheezing:         Neurologic    Sudden weakness in arms or legs:     Sudden numbness in arms or legs:     Sudden onset of difficulty speaking or slurred speech:    Temporary loss of vision in one eye:     Problems with dizziness:         Gastrointestinal    Blood in stool:     Vomited blood:         Genitourinary    Burning when urinating:     Blood in urine:        Psychiatric    Major depression:         Hematologic    Bleeding problems:    Problems with blood clotting too easily:        Skin    Rashes or ulcers:        Constitutional    Fever or chills:      PHYSICAL EXAM: There were no vitals filed for this visit.  GENERAL: The patient is a well-nourished male, in no acute distress. The vital signs are documented above. CARDIAC: There is a regular rate and rhythm.  VASCULAR:  Bilateral femoral pulses palpable Bilateral dorsalis pedis pulses palpable No lower extremity tissue loss PULMONARY: No respiratory distress. ABDOMEN: Soft and non-tender.  MUSCULOSKELETAL: There are no major deformities or cyanosis. NEUROLOGIC: No focal weakness or paresthesias are detected. SKIN: Purple hue to toes PSYCHIATRIC: The patient has a normal affect.  DATA:   ABI's today were noncompressible with normal triphasic waveforms at the ankle and adequate pulsatile toe tracings  Assessment/Plan:  72 y.o. male, with history of diabetes and hypothyroidism that presents for evaluation of bilateral coolness and purple discoloration to his toes.  Discussed that I do not think he has any evidence of significant arterial insufficiency at  this time.  He has palpable pedal pulses  on exam with normal triphasic waveforms at the ankle.  He does have evidence of calcification given his ABIs were noncompressible but again has a normal exam.  His main complaint is purple discoloration to the toes and I suspect he has underlying venous insufficiency.  I will bring him back for lower extremity reflux studies and further evaluation for chronic venous insufficiency.   Cephus Shelling, MD [facility] Office: [phone]

## 2023-04-21 ENCOUNTER — Other Ambulatory Visit: Payer: Self-pay

## 2023-04-21 DIAGNOSIS — L819 Disorder of pigmentation, unspecified: Secondary | ICD-10-CM

## 2023-04-27 DIAGNOSIS — E890 Postprocedural hypothyroidism: Secondary | ICD-10-CM | POA: Diagnosis not present

## 2023-05-08 DIAGNOSIS — L821 Other seborrheic keratosis: Secondary | ICD-10-CM | POA: Diagnosis not present

## 2023-05-08 DIAGNOSIS — L814 Other melanin hyperpigmentation: Secondary | ICD-10-CM | POA: Diagnosis not present

## 2023-05-08 DIAGNOSIS — D225 Melanocytic nevi of trunk: Secondary | ICD-10-CM | POA: Diagnosis not present

## 2023-05-21 DIAGNOSIS — G4733 Obstructive sleep apnea (adult) (pediatric): Secondary | ICD-10-CM | POA: Diagnosis not present

## 2023-05-21 DIAGNOSIS — E1140 Type 2 diabetes mellitus with diabetic neuropathy, unspecified: Secondary | ICD-10-CM | POA: Diagnosis not present

## 2023-06-01 NOTE — Progress Notes (Unsigned)
Patient name: Allen Mcdaniel MRN: [id number] DOB: 1951/05/28 Sex: male  REASON FOR CONSULT: Venous reflux study  HPI: Allen Mcdaniel is a 72 y.o. male, with history of diabetes and hypothyroidism that presents for further follow-up with venous reflux study.  Initially seen with bilateral coolness and discoloration of his toes.  Ultimately had palpable pedal pulses.  We do not feel this was likely related to significant arterial insufficiency.  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    Past Medical History:  Diagnosis Date   Allergy    Anxiety    Arthritis    Cancer ([facility])    Complication of anesthesia    with back surgery-too much Morphine,O2 too low   Diabetes mellitus without complication ([facility])    H/O hiatal hernia    Hyperlipidemia    Hypertension    Sleep apnea    setting -18   Thyroid disease     Past Surgical History:  Procedure Laterality Date   BACK SURGERY     JOINT REPLACEMENT     left and right knee   SHOULDER SURGERY     THYROID LOBECTOMY Right 06/23/2013   Procedure: THYROID LOBECTOMY;  Surgeon: Velora Heckler, MD;  Location: [facility];  Service: General;  Laterality: Right;   TONSILLECTOMY     as child    No family history on file.  SOCIAL HISTORY: Social History   Socioeconomic History   Marital status: Married    Spouse name: Not on file   Number of children: Not on file   Years of education: Not on file   Highest education level: Not on file  Occupational History   Not on file  Tobacco Use   Smoking status: Never   Smokeless tobacco: Never  Substance and Sexual Activity   Alcohol use: Not Currently    Comment: occassionally   Drug use: No   Sexual activity: Not on file  Other Topics Concern   Not on file  Social History Narrative   Not on file   Social Determinants of Health   Financial Resource Strain: Not on file  Food Insecurity: Not on file  Transportation Needs: Not on file  Physical Activity: Not on file  Stress: Not on  file  Social Connections: Not on file  Intimate Partner Violence: Not on file    Allergies  Allergen Reactions   Morphine Nausea And Vomiting    Current Outpatient Medications  Medication Sig Dispense Refill   aspirin 81 MG tablet Take 81 mg by mouth daily.     CELEBREX 200 MG capsule Take 200 mg by mouth once a week.  (Patient not taking: Reported on 07/20/2020)     diazepam (VALIUM) 5 MG tablet Take 5 mg by mouth every 6 (six) hours as needed for anxiety.     enalapril-hydrochlorothiazide (VASERETIC) 10-25 MG per tablet Take 1 tablet by mouth daily.     FLUoxetine (PROZAC) 40 MG capsule Take 40 mg by mouth daily.      HYDROcodone-acetaminophen (NORCO/VICODIN) 5-325 MG tablet Take 1-2 tablets by mouth every 6 (six) hours as needed. (Patient not taking: Reported on 07/20/2020) 6 tablet 0   levothyroxine (SYNTHROID, LEVOTHROID) 25 MCG tablet Take 25 mcg by mouth daily before breakfast.     loratadine (CLARITIN) 10 MG tablet Take 10 mg by mouth daily. (Patient not taking: Reported on 07/20/2020)     metFORMIN (GLUCOPHAGE) 500 MG tablet Take 500 mg by mouth 2 (two) times  daily with a meal.     predniSONE (DELTASONE) 20 MG tablet Take 20 mg by mouth daily with breakfast. Take 3 tablets x 3 days, 2 tablets x 3 days, 1 tablet x 3 days once a day orally for 9 days (Patient not taking: Reported on 07/20/2020)     pregabalin (LYRICA) 50 MG capsule Take 50 mg by mouth 2 (two) times a week. (Patient not taking: Reported on 07/20/2020)     rosuvastatin (CRESTOR) 20 MG tablet Take 10 mg by mouth daily.      zolpidem (AMBIEN) 10 MG tablet Take 10 mg by mouth at bedtime as needed for sleep.     No current facility-administered medications for this visit.    REVIEW OF SYSTEMS:  [X]  denotes positive finding, [ ]  denotes negative finding Cardiac  Comments:  Chest pain or chest pressure:    Shortness of breath upon exertion:    Short of breath when lying flat:    Irregular heart rhythm:         Vascular    Pain in calf, thigh, or hip brought on by ambulation:    Pain in feet at night that wakes you up from your sleep:     Blood clot in your veins:    Leg swelling:         Pulmonary    Oxygen at home:    Productive cough:     Wheezing:         Neurologic    Sudden weakness in arms or legs:     Sudden numbness in arms or legs:     Sudden onset of difficulty speaking or slurred speech:    Temporary loss of vision in one eye:     Problems with dizziness:         Gastrointestinal    Blood in stool:     Vomited blood:         Genitourinary    Burning when urinating:     Blood in urine:        Psychiatric    Major depression:         Hematologic    Bleeding problems:    Problems with blood clotting too easily:        Skin    Rashes or ulcers:        Constitutional    Fever or chills:      PHYSICAL EXAM: There were no vitals filed for this visit.  GENERAL: The patient is a well-nourished male, in no acute distress. The vital signs are documented above. CARDIAC: There is a regular rate and rhythm.  VASCULAR:  Bilateral femoral pulses palpable Bilateral dorsalis pedis pulses palpable No lower extremity tissue loss PULMONARY: No respiratory distress. ABDOMEN: Soft and non-tender.  MUSCULOSKELETAL: There are no major deformities or cyanosis. NEUROLOGIC: No focal weakness or paresthesias are detected. SKIN: Purple hue to toes PSYCHIATRIC: The patient has a normal affect.  DATA:     ABI's previously were noncompressible with normal triphasic waveforms at the ankle and adequate pulsatile toe tracings  Assessment/Plan:  72 y.o. male, with history of diabetes and hypothyroidism that presents for venous reflux study and evaluation of chronic venous insufficiency.  Previously seen with bilateral coolness and discoloration of his toes.  I did not feel he had significant arterial insufficiency and although his ABIs are noncompressible he had palpable pedal  pulses.    Allen Shelling, MD [facility] Office: [phone]

## 2023-06-02 ENCOUNTER — Encounter: Payer: Self-pay | Admitting: Vascular Surgery

## 2023-06-02 ENCOUNTER — Ambulatory Visit (INDEPENDENT_AMBULATORY_CARE_PROVIDER_SITE_OTHER): Payer: Medicare Other | Admitting: Vascular Surgery

## 2023-06-02 ENCOUNTER — Ambulatory Visit (HOSPITAL_COMMUNITY)
Admission: RE | Admit: 2023-06-02 | Discharge: 2023-06-02 | Disposition: A | Discharge status: Home / Self Care | Payer: Medicare Other | Source: Ambulatory Visit | Attending: Vascular Surgery | Admitting: Vascular Surgery

## 2023-06-02 VITALS — BP 127/77 | HR 92 | Temp 97.8°F | Ht 68.0 in | Wt 224.0 lb

## 2023-06-02 DIAGNOSIS — I872 Venous insufficiency (chronic) (peripheral): Secondary | ICD-10-CM | POA: Insufficient documentation

## 2023-06-02 DIAGNOSIS — M7989 Other specified soft tissue disorders: Secondary | ICD-10-CM

## 2023-06-02 DIAGNOSIS — L819 Disorder of pigmentation, unspecified: Secondary | ICD-10-CM | POA: Diagnosis not present

## 2023-06-09 DIAGNOSIS — E890 Postprocedural hypothyroidism: Secondary | ICD-10-CM | POA: Diagnosis not present

## 2023-06-10 DIAGNOSIS — I10 Essential (primary) hypertension: Secondary | ICD-10-CM | POA: Diagnosis not present

## 2023-06-10 DIAGNOSIS — Z0000 Encounter for general adult medical examination without abnormal findings: Secondary | ICD-10-CM | POA: Diagnosis not present

## 2023-06-10 DIAGNOSIS — E039 Hypothyroidism, unspecified: Secondary | ICD-10-CM | POA: Diagnosis not present

## 2023-06-10 DIAGNOSIS — E1140 Type 2 diabetes mellitus with diabetic neuropathy, unspecified: Secondary | ICD-10-CM | POA: Diagnosis not present

## 2023-06-10 DIAGNOSIS — D649 Anemia, unspecified: Secondary | ICD-10-CM | POA: Diagnosis not present

## 2023-06-10 DIAGNOSIS — M109 Gout, unspecified: Secondary | ICD-10-CM | POA: Diagnosis not present

## 2023-06-10 DIAGNOSIS — F411 Generalized anxiety disorder: Secondary | ICD-10-CM | POA: Diagnosis not present

## 2023-06-10 DIAGNOSIS — E785 Hyperlipidemia, unspecified: Secondary | ICD-10-CM | POA: Diagnosis not present

## 2023-07-23 DIAGNOSIS — H401131 Primary open-angle glaucoma, bilateral, mild stage: Secondary | ICD-10-CM | POA: Diagnosis not present

## 2023-07-23 DIAGNOSIS — H353131 Nonexudative age-related macular degeneration, bilateral, early dry stage: Secondary | ICD-10-CM | POA: Diagnosis not present

## 2023-07-23 DIAGNOSIS — H35372 Puckering of macula, left eye: Secondary | ICD-10-CM | POA: Diagnosis not present

## 2023-07-23 DIAGNOSIS — E119 Type 2 diabetes mellitus without complications: Secondary | ICD-10-CM | POA: Diagnosis not present
